# Patient Record
Sex: Female | Born: 1955 | Race: White | Hispanic: No | Marital: Married | Smoking: Former smoker
Health system: Southern US, Community
[De-identification: ages and names within clinical notes are randomized; demographics above are authoritative.]

## PROBLEM LIST (undated history)

## (undated) DIAGNOSIS — K5730 Diverticulosis of large intestine without perforation or abscess without bleeding: Secondary | ICD-10-CM

## (undated) DIAGNOSIS — R6889 Other general symptoms and signs: Secondary | ICD-10-CM

## (undated) DIAGNOSIS — K449 Diaphragmatic hernia without obstruction or gangrene: Secondary | ICD-10-CM

## (undated) DIAGNOSIS — D126 Benign neoplasm of colon, unspecified: Secondary | ICD-10-CM

## (undated) DIAGNOSIS — G479 Sleep disorder, unspecified: Secondary | ICD-10-CM

## (undated) DIAGNOSIS — I8390 Asymptomatic varicose veins of unspecified lower extremity: Secondary | ICD-10-CM

## (undated) DIAGNOSIS — E785 Hyperlipidemia, unspecified: Secondary | ICD-10-CM

## (undated) DIAGNOSIS — K219 Gastro-esophageal reflux disease without esophagitis: Secondary | ICD-10-CM

## (undated) DIAGNOSIS — R202 Paresthesia of skin: Secondary | ICD-10-CM

## (undated) DIAGNOSIS — M8580 Other specified disorders of bone density and structure, unspecified site: Secondary | ICD-10-CM

## (undated) DIAGNOSIS — R209 Unspecified disturbances of skin sensation: Secondary | ICD-10-CM

## (undated) DIAGNOSIS — O1410 Severe pre-eclampsia, unspecified trimester: Secondary | ICD-10-CM

## (undated) DIAGNOSIS — S5290XA Unspecified fracture of unspecified forearm, initial encounter for closed fracture: Secondary | ICD-10-CM

## (undated) DIAGNOSIS — I10 Essential (primary) hypertension: Secondary | ICD-10-CM

## (undated) HISTORY — DX: Gastro-esophageal reflux disease without esophagitis: K21.9

## (undated) HISTORY — DX: Diverticulosis of large intestine without perforation or abscess without bleeding: K57.30

## (undated) HISTORY — DX: Sleep disorder, unspecified: G47.9

## (undated) HISTORY — DX: Diaphragmatic hernia without obstruction or gangrene: K44.9

## (undated) HISTORY — DX: Asymptomatic varicose veins of unspecified lower extremity: I83.90

## (undated) HISTORY — DX: Essential (primary) hypertension: I10

## (undated) HISTORY — DX: Other general symptoms and signs: R68.89

## (undated) HISTORY — DX: Paresthesia of skin: R20.2

## (undated) HISTORY — DX: Unspecified fracture of unspecified forearm, initial encounter for closed fracture: S52.90XA

## (undated) HISTORY — DX: Unspecified disturbances of skin sensation: R20.9

## (undated) HISTORY — DX: Benign neoplasm of colon, unspecified: D12.6

## (undated) HISTORY — DX: Severe pre-eclampsia, unspecified trimester: O14.10

## (undated) HISTORY — DX: Other specified disorders of bone density and structure, unspecified site: M85.80

## (undated) HISTORY — DX: Hyperlipidemia, unspecified: E78.5

---

## 1999-09-29 ENCOUNTER — Other Ambulatory Visit: Admission: RE | Admit: 1999-09-29 | Discharge: 1999-09-29 | Payer: Self-pay | Admitting: *Deleted

## 2002-04-01 ENCOUNTER — Other Ambulatory Visit: Admission: RE | Admit: 2002-04-01 | Discharge: 2002-04-01 | Payer: Self-pay | Admitting: Obstetrics and Gynecology

## 2003-04-09 ENCOUNTER — Other Ambulatory Visit: Admission: RE | Admit: 2003-04-09 | Discharge: 2003-04-09 | Payer: Self-pay | Admitting: Obstetrics and Gynecology

## 2003-06-08 ENCOUNTER — Encounter: Payer: Self-pay | Admitting: Emergency Medicine

## 2003-06-08 ENCOUNTER — Emergency Department (HOSPITAL_COMMUNITY): Admission: EM | Admit: 2003-06-08 | Discharge: 2003-06-08 | Payer: Self-pay | Admitting: Family Medicine

## 2004-02-24 ENCOUNTER — Ambulatory Visit (HOSPITAL_COMMUNITY): Admission: RE | Admit: 2004-02-24 | Discharge: 2004-02-24 | Payer: Self-pay | Admitting: Neurology

## 2004-02-29 ENCOUNTER — Other Ambulatory Visit: Admission: RE | Admit: 2004-02-29 | Discharge: 2004-02-29 | Payer: Self-pay | Admitting: Obstetrics and Gynecology

## 2004-10-27 ENCOUNTER — Ambulatory Visit: Payer: Self-pay | Admitting: Internal Medicine

## 2004-12-07 ENCOUNTER — Ambulatory Visit: Payer: Self-pay | Admitting: Internal Medicine

## 2004-12-14 ENCOUNTER — Ambulatory Visit: Payer: Self-pay | Admitting: Internal Medicine

## 2004-12-26 ENCOUNTER — Ambulatory Visit: Payer: Self-pay | Admitting: Internal Medicine

## 2004-12-27 ENCOUNTER — Ambulatory Visit: Payer: Self-pay | Admitting: Gastroenterology

## 2005-01-04 ENCOUNTER — Ambulatory Visit: Payer: Self-pay | Admitting: Gastroenterology

## 2005-01-13 ENCOUNTER — Ambulatory Visit: Payer: Self-pay | Admitting: Internal Medicine

## 2005-05-12 ENCOUNTER — Ambulatory Visit: Payer: Self-pay | Admitting: Internal Medicine

## 2005-08-16 ENCOUNTER — Ambulatory Visit: Payer: Self-pay | Admitting: Family Medicine

## 2005-10-27 ENCOUNTER — Ambulatory Visit: Payer: Self-pay | Admitting: Internal Medicine

## 2005-11-24 ENCOUNTER — Ambulatory Visit: Payer: Self-pay | Admitting: Internal Medicine

## 2005-12-01 ENCOUNTER — Ambulatory Visit: Payer: Self-pay | Admitting: Internal Medicine

## 2005-12-05 ENCOUNTER — Ambulatory Visit: Payer: Self-pay | Admitting: Internal Medicine

## 2006-08-31 ENCOUNTER — Ambulatory Visit: Payer: Self-pay | Admitting: Internal Medicine

## 2006-08-31 LAB — CONVERTED CEMR LAB
ALT: 15 units/L (ref 0–40)
AST: 19 units/L (ref 0–37)
Albumin: 3.9 g/dL (ref 3.5–5.2)
BUN: 12 mg/dL (ref 6–23)
Basophils Relative: 0.3 % (ref 0.0–1.0)
CO2: 28 meq/L (ref 19–32)
Chloride: 106 meq/L (ref 96–112)
Creatinine, Ser: 0.8 mg/dL (ref 0.4–1.2)
Eosinophil percent: 2 % (ref 0.0–5.0)
GFR calc non Af Amer: 80 mL/min
Glomerular Filtration Rate, Af Am: 97 mL/min/{1.73_m2}
Glucose, Bld: 84 mg/dL (ref 70–99)
Hemoglobin: 14 g/dL (ref 12.0–15.0)
Iron: 63 ug/dL (ref 42–145)
Lymphocytes Relative: 37.1 % (ref 12.0–46.0)
Magnesium: 2.3 mg/dL (ref 1.5–2.5)
Monocytes Relative: 4.9 % (ref 3.0–11.0)
Neutrophils Relative %: 55.7 % (ref 43.0–77.0)
Platelets: 264 10*3/uL (ref 150–400)
Potassium: 3.4 meq/L — ABNORMAL LOW (ref 3.5–5.1)
RBC: 4.83 M/uL (ref 3.87–5.11)
RDW: 12.3 % (ref 11.5–14.6)
Total Bilirubin: 0.5 mg/dL (ref 0.3–1.2)
Transferrin: 220 mg/dL (ref >=212.0)

## 2006-10-22 ENCOUNTER — Ambulatory Visit: Payer: Self-pay | Admitting: Internal Medicine

## 2006-10-22 LAB — CONVERTED CEMR LAB
Magnesium: 2.4 mg/dL (ref 1.5–2.5)
Potassium: 4.4 meq/L (ref 3.5–5.1)

## 2006-11-20 DIAGNOSIS — R202 Paresthesia of skin: Secondary | ICD-10-CM

## 2006-11-20 HISTORY — DX: Paresthesia of skin: R20.2

## 2006-12-21 ENCOUNTER — Ambulatory Visit: Payer: Self-pay | Admitting: Internal Medicine

## 2008-01-31 ENCOUNTER — Encounter: Payer: Self-pay | Admitting: Internal Medicine

## 2008-03-16 ENCOUNTER — Ambulatory Visit: Payer: Self-pay | Admitting: Internal Medicine

## 2008-03-16 DIAGNOSIS — M25569 Pain in unspecified knee: Secondary | ICD-10-CM

## 2008-03-16 DIAGNOSIS — A630 Anogenital (venereal) warts: Secondary | ICD-10-CM

## 2008-03-16 DIAGNOSIS — K449 Diaphragmatic hernia without obstruction or gangrene: Secondary | ICD-10-CM | POA: Insufficient documentation

## 2008-03-16 DIAGNOSIS — K219 Gastro-esophageal reflux disease without esophagitis: Secondary | ICD-10-CM

## 2008-03-16 DIAGNOSIS — M949 Disorder of cartilage, unspecified: Secondary | ICD-10-CM

## 2008-03-16 DIAGNOSIS — M899 Disorder of bone, unspecified: Secondary | ICD-10-CM | POA: Insufficient documentation

## 2008-03-16 DIAGNOSIS — I10 Essential (primary) hypertension: Secondary | ICD-10-CM | POA: Insufficient documentation

## 2008-03-16 HISTORY — DX: Diaphragmatic hernia without obstruction or gangrene: K44.9

## 2008-03-19 ENCOUNTER — Telehealth (INDEPENDENT_AMBULATORY_CARE_PROVIDER_SITE_OTHER): Payer: Self-pay | Admitting: *Deleted

## 2008-03-21 DIAGNOSIS — E785 Hyperlipidemia, unspecified: Secondary | ICD-10-CM

## 2008-03-25 ENCOUNTER — Ambulatory Visit: Payer: Self-pay | Admitting: Internal Medicine

## 2008-03-25 LAB — CONVERTED CEMR LAB
Blood in Urine, dipstick: NEGATIVE
Nitrite: NEGATIVE
Specific Gravity, Urine: 1.02
Urobilinogen, UA: 0.2
pH: 5

## 2008-03-26 LAB — CONVERTED CEMR LAB
ALT: 19 units/L (ref 0–35)
AST: 21 units/L (ref 0–37)
BUN: 12 mg/dL (ref 6–23)
Basophils Absolute: 0 10*3/uL (ref 0.0–0.1)
Bilirubin, Direct: 0.1 mg/dL (ref 0.0–0.3)
Calcium: 9.3 mg/dL (ref 8.4–10.5)
Eosinophils Absolute: 0.2 10*3/uL (ref 0.0–0.7)
Eosinophils Relative: 2.3 % (ref 0.0–5.0)
GFR calc Af Amer: 84 mL/min
HCT: 40.1 % (ref 36.0–46.0)
HDL: 45.4 mg/dL (ref >39.0)
LDL Cholesterol: 118 mg/dL — ABNORMAL HIGH (ref 0–99)
Monocytes Absolute: 0.3 10*3/uL (ref 0.1–1.0)
Neutro Abs: 4 10*3/uL (ref 1.4–7.7)
Platelets: 239 10*3/uL (ref 150–400)
TSH: 0.95 microintl units/mL (ref 0.35–5.50)
Total Protein: 7 g/dL (ref 6.0–8.3)
Triglycerides: 180 mg/dL — ABNORMAL HIGH (ref 0–149)
VLDL: 36 mg/dL (ref 0–40)

## 2008-03-30 ENCOUNTER — Telehealth: Payer: Self-pay | Admitting: Internal Medicine

## 2008-03-31 ENCOUNTER — Telehealth (INDEPENDENT_AMBULATORY_CARE_PROVIDER_SITE_OTHER): Payer: Self-pay | Admitting: *Deleted

## 2008-04-03 ENCOUNTER — Ambulatory Visit: Payer: Self-pay | Admitting: Internal Medicine

## 2008-04-03 DIAGNOSIS — M810 Age-related osteoporosis without current pathological fracture: Secondary | ICD-10-CM | POA: Insufficient documentation

## 2008-04-03 DIAGNOSIS — M8580 Other specified disorders of bone density and structure, unspecified site: Secondary | ICD-10-CM

## 2008-04-03 DIAGNOSIS — D485 Neoplasm of uncertain behavior of skin: Secondary | ICD-10-CM

## 2008-04-03 DIAGNOSIS — I8390 Asymptomatic varicose veins of unspecified lower extremity: Secondary | ICD-10-CM

## 2008-04-03 HISTORY — DX: Other specified disorders of bone density and structure, unspecified site: M85.80

## 2008-04-03 HISTORY — DX: Asymptomatic varicose veins of unspecified lower extremity: I83.90

## 2008-05-11 ENCOUNTER — Encounter: Payer: Self-pay | Admitting: Internal Medicine

## 2008-05-12 ENCOUNTER — Encounter: Admission: RE | Admit: 2008-05-12 | Discharge: 2008-05-12 | Payer: Self-pay | Admitting: Internal Medicine

## 2008-05-12 ENCOUNTER — Encounter: Payer: Self-pay | Admitting: Internal Medicine

## 2008-05-29 ENCOUNTER — Telehealth: Payer: Self-pay | Admitting: *Deleted

## 2008-06-08 ENCOUNTER — Telehealth: Payer: Self-pay | Admitting: Internal Medicine

## 2008-06-09 ENCOUNTER — Encounter: Payer: Self-pay | Admitting: Internal Medicine

## 2008-06-09 ENCOUNTER — Ambulatory Visit: Payer: Self-pay | Admitting: Internal Medicine

## 2008-08-21 ENCOUNTER — Telehealth: Payer: Self-pay | Admitting: *Deleted

## 2008-08-27 ENCOUNTER — Telehealth: Payer: Self-pay | Admitting: Internal Medicine

## 2008-09-01 ENCOUNTER — Ambulatory Visit: Payer: Self-pay | Admitting: Family Medicine

## 2008-09-09 ENCOUNTER — Ambulatory Visit: Payer: Self-pay | Admitting: Internal Medicine

## 2008-09-23 ENCOUNTER — Telehealth: Payer: Self-pay | Admitting: Internal Medicine

## 2008-10-02 ENCOUNTER — Telehealth: Payer: Self-pay | Admitting: *Deleted

## 2008-10-02 ENCOUNTER — Telehealth: Payer: Self-pay | Admitting: Internal Medicine

## 2008-12-18 ENCOUNTER — Ambulatory Visit: Payer: Self-pay | Admitting: Internal Medicine

## 2008-12-18 ENCOUNTER — Encounter: Admission: RE | Admit: 2008-12-18 | Discharge: 2008-12-18 | Payer: Self-pay | Admitting: Internal Medicine

## 2008-12-18 DIAGNOSIS — R1032 Left lower quadrant pain: Secondary | ICD-10-CM | POA: Insufficient documentation

## 2008-12-18 LAB — CONVERTED CEMR LAB
ALT: 19 units/L (ref 0–35)
AST: 18 units/L (ref 0–37)
Basophils Absolute: 0.1 10*3/uL (ref 0.0–0.1)
Bilirubin Urine: NEGATIVE
Bilirubin, Direct: 0.1 mg/dL (ref 0.0–0.3)
Blood in Urine, dipstick: NEGATIVE
Glucose, Urine, Semiquant: NEGATIVE
HCT: 37.3 % (ref 36.0–46.0)
Hemoglobin: 13 g/dL (ref 12.0–15.0)
Ketones, urine, test strip: NEGATIVE
MCHC: 34.8 g/dL (ref 30.0–36.0)
MCV: 85.9 fL (ref 78.0–100.0)
Protein, U semiquant: NEGATIVE
RBC: 4.34 M/uL (ref 3.87–5.11)
Total Bilirubin: 0.6 mg/dL (ref 0.3–1.2)
WBC Urine, dipstick: NEGATIVE
WBC: 11.7 10*3/uL — ABNORMAL HIGH (ref 4.5–10.5)
pH: 5.5

## 2009-01-01 DIAGNOSIS — O1410 Severe pre-eclampsia, unspecified trimester: Secondary | ICD-10-CM | POA: Insufficient documentation

## 2009-01-01 DIAGNOSIS — K5730 Diverticulosis of large intestine without perforation or abscess without bleeding: Secondary | ICD-10-CM | POA: Insufficient documentation

## 2009-01-01 DIAGNOSIS — D126 Benign neoplasm of colon, unspecified: Secondary | ICD-10-CM

## 2009-01-01 HISTORY — DX: Severe pre-eclampsia, unspecified trimester: O14.10

## 2009-01-01 HISTORY — DX: Benign neoplasm of colon, unspecified: D12.6

## 2009-01-01 HISTORY — DX: Diverticulosis of large intestine without perforation or abscess without bleeding: K57.30

## 2009-01-06 ENCOUNTER — Ambulatory Visit: Payer: Self-pay | Admitting: Gastroenterology

## 2009-02-09 ENCOUNTER — Telehealth: Payer: Self-pay | Admitting: Internal Medicine

## 2009-03-20 LAB — CONVERTED CEMR LAB: Pap Smear: NORMAL

## 2009-05-21 ENCOUNTER — Ambulatory Visit: Payer: Self-pay | Admitting: Internal Medicine

## 2009-05-21 LAB — CONVERTED CEMR LAB
AST: 20 units/L (ref 0–37)
Basophils Relative: 0.6 % (ref 0.0–3.0)
Bilirubin Urine: NEGATIVE
Bilirubin, Direct: 0 mg/dL (ref 0.0–0.3)
Blood in Urine, dipstick: NEGATIVE
CO2: 32 meq/L (ref 19–32)
Chloride: 108 meq/L (ref 96–112)
Eosinophils Relative: 4.2 % (ref 0.0–5.0)
Glucose, Bld: 89 mg/dL (ref 70–99)
Glucose, Urine, Semiquant: NEGATIVE
HCT: 37.8 % (ref 36.0–46.0)
LDL Cholesterol: 78 mg/dL (ref 0–99)
Lymphocytes Relative: 39.2 % (ref 12.0–46.0)
MCV: 86.5 fL (ref 78.0–100.0)
Monocytes Absolute: 0.4 10*3/uL (ref 0.1–1.0)
Neutro Abs: 3.9 10*3/uL (ref 1.4–7.7)
Neutrophils Relative %: 50.9 % (ref 43.0–77.0)
Potassium: 3.5 meq/L (ref 3.5–5.1)
RBC: 4.37 M/uL (ref 3.87–5.11)
Specific Gravity, Urine: >=1.03
TSH: 0.9 microintl units/mL (ref 0.35–5.50)
Total Bilirubin: 0.6 mg/dL (ref 0.3–1.2)
Total CHOL/HDL Ratio: 4
Urobilinogen, UA: 1
VLDL: 29.6 mg/dL (ref 0.0–40.0)
WBC: 7.5 10*3/uL (ref 4.5–10.5)

## 2009-06-02 ENCOUNTER — Telehealth: Payer: Self-pay | Admitting: Internal Medicine

## 2009-06-07 HISTORY — PX: OTHER SURGICAL HISTORY: SHX169

## 2009-06-09 ENCOUNTER — Ambulatory Visit: Payer: Self-pay | Admitting: Internal Medicine

## 2009-06-09 DIAGNOSIS — R05 Cough: Secondary | ICD-10-CM

## 2009-06-09 DIAGNOSIS — S5290XA Unspecified fracture of unspecified forearm, initial encounter for closed fracture: Secondary | ICD-10-CM | POA: Insufficient documentation

## 2009-06-09 HISTORY — DX: Unspecified fracture of unspecified forearm, initial encounter for closed fracture: S52.90XA

## 2009-06-17 ENCOUNTER — Ambulatory Visit: Payer: Self-pay | Admitting: Internal Medicine

## 2009-07-23 ENCOUNTER — Encounter: Admission: RE | Admit: 2009-07-23 | Discharge: 2009-07-23 | Payer: Self-pay | Admitting: Orthopedic Surgery

## 2009-08-24 ENCOUNTER — Ambulatory Visit: Payer: Self-pay | Admitting: Internal Medicine

## 2009-08-24 DIAGNOSIS — R6889 Other general symptoms and signs: Secondary | ICD-10-CM

## 2009-08-24 DIAGNOSIS — G479 Sleep disorder, unspecified: Secondary | ICD-10-CM | POA: Insufficient documentation

## 2009-08-24 HISTORY — DX: Sleep disorder, unspecified: G47.9

## 2009-08-24 HISTORY — DX: Other general symptoms and signs: R68.89

## 2009-09-22 ENCOUNTER — Encounter (INDEPENDENT_AMBULATORY_CARE_PROVIDER_SITE_OTHER): Payer: Self-pay | Admitting: *Deleted

## 2009-10-20 ENCOUNTER — Ambulatory Visit: Payer: Self-pay | Admitting: Internal Medicine

## 2009-10-28 ENCOUNTER — Ambulatory Visit (HOSPITAL_COMMUNITY): Admission: RE | Admit: 2009-10-28 | Discharge: 2009-10-28 | Payer: Self-pay | Admitting: Internal Medicine

## 2009-11-29 ENCOUNTER — Encounter (INDEPENDENT_AMBULATORY_CARE_PROVIDER_SITE_OTHER): Payer: Self-pay | Admitting: *Deleted

## 2010-02-23 ENCOUNTER — Telehealth: Payer: Self-pay | Admitting: *Deleted

## 2010-05-24 ENCOUNTER — Telehealth: Payer: Self-pay | Admitting: Internal Medicine

## 2010-05-26 ENCOUNTER — Ambulatory Visit: Payer: Self-pay | Admitting: Internal Medicine

## 2010-05-26 LAB — CONVERTED CEMR LAB
ALT: 13 units/L (ref 0–35)
AST: 17 units/L (ref 0–37)
Albumin: 4 g/dL (ref 3.5–5.2)
Basophils Relative: 0.6 % (ref 0.0–3.0)
Bilirubin Urine: NEGATIVE
CO2: 26 meq/L (ref 19–32)
Chloride: 106 meq/L (ref 96–112)
Cholesterol: 218 mg/dL — ABNORMAL HIGH (ref 0–200)
Direct LDL: 122 mg/dL
Eosinophils Absolute: 0.2 10*3/uL (ref 0.0–0.7)
Eosinophils Relative: 3.1 % (ref 0.0–5.0)
Glucose, Bld: 74 mg/dL (ref 70–99)
HCT: 39 % (ref 36.0–46.0)
HDL: 50.9 mg/dL (ref >39.00)
Lymphocytes Relative: 38.8 % (ref 12.0–46.0)
Lymphs Abs: 2.9 10*3/uL (ref 0.7–4.0)
MCHC: 33.8 g/dL (ref 30.0–36.0)
Monocytes Relative: 5.9 % (ref 3.0–12.0)
Neutrophils Relative %: 51.6 % (ref 43.0–77.0)
Nitrite: NEGATIVE
Platelets: 224 10*3/uL (ref 150.0–400.0)
RBC: 4.46 M/uL (ref 3.87–5.11)
Total CHOL/HDL Ratio: 4
Total Protein: 6.6 g/dL (ref 6.0–8.3)
Triglycerides: 226 mg/dL — ABNORMAL HIGH (ref 0.0–149.0)
Urobilinogen, UA: 0.2
pH: 5

## 2010-06-13 ENCOUNTER — Ambulatory Visit: Payer: Self-pay | Admitting: Internal Medicine

## 2010-06-13 DIAGNOSIS — M25519 Pain in unspecified shoulder: Secondary | ICD-10-CM | POA: Insufficient documentation

## 2010-06-13 DIAGNOSIS — R209 Unspecified disturbances of skin sensation: Secondary | ICD-10-CM | POA: Insufficient documentation

## 2010-06-13 HISTORY — DX: Unspecified disturbances of skin sensation: R20.9

## 2010-06-28 ENCOUNTER — Encounter: Payer: Self-pay | Admitting: Internal Medicine

## 2010-07-11 ENCOUNTER — Encounter: Payer: Self-pay | Admitting: Gastroenterology

## 2010-07-21 IMAGING — US US TRANSVAGINAL NON-OB
1 series · 14 of 25 positions shown · non-contrast
Comparison: None.

CLINICAL DATA: Left lower quadrant pain.  Question ovarian
pathology.

TRANSABDOMINAL AND TRANSVAGINAL ULTRASOUND OF PELVIS
TECHNIQUE: Both transabdominal and transvaginal ultrasound
examinations of the pelvis were performed including evaluation of
the uterus, ovaries, adnexal regions, and pelvic cul-de-sac.

[Series 1: us transvaginal non-ob · 0.32mm/px · 14 of 72 slices shown]
[im 1/72]
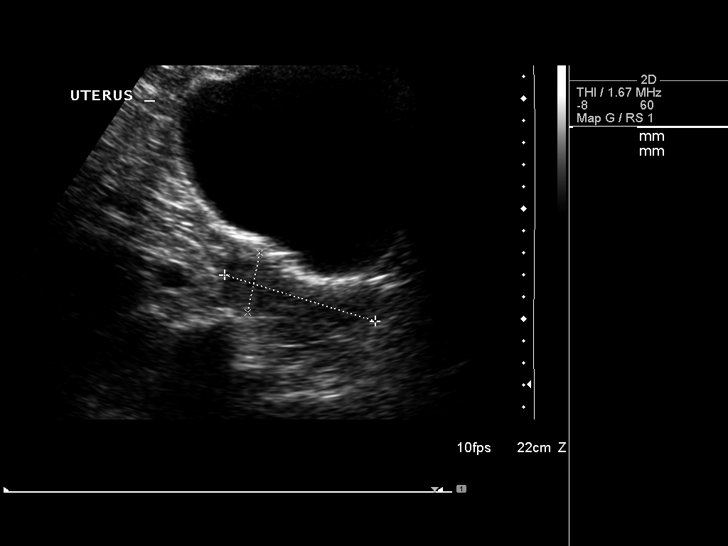
[im 6/72]
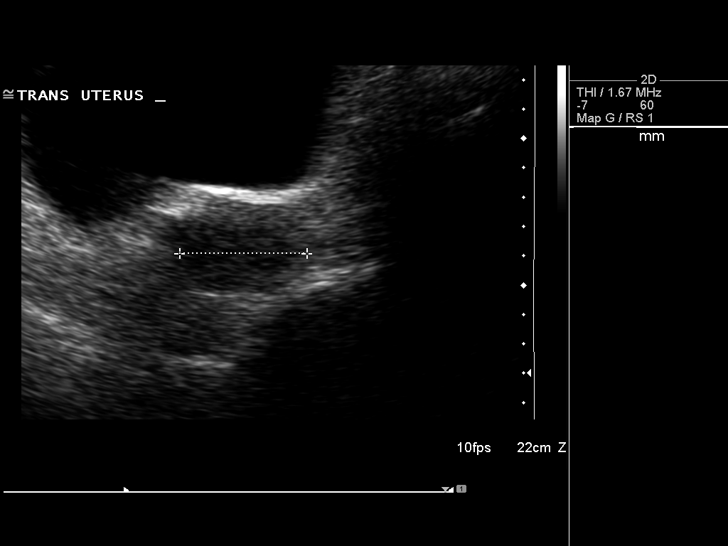
[im 12/72]
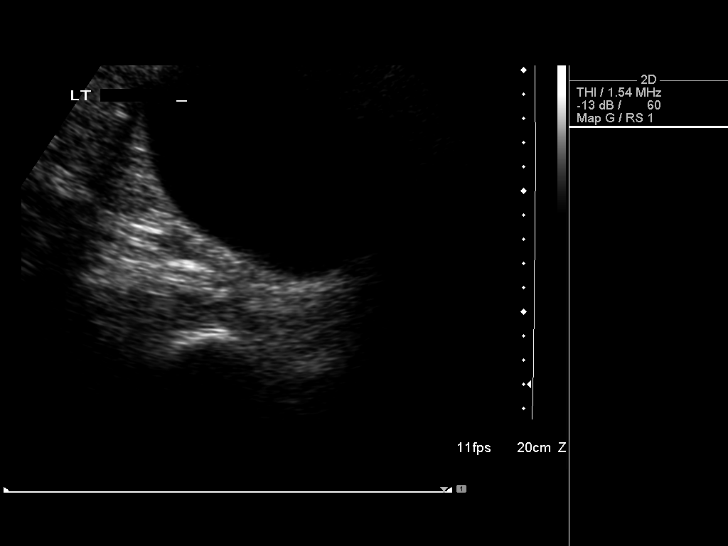
[im 18/72]
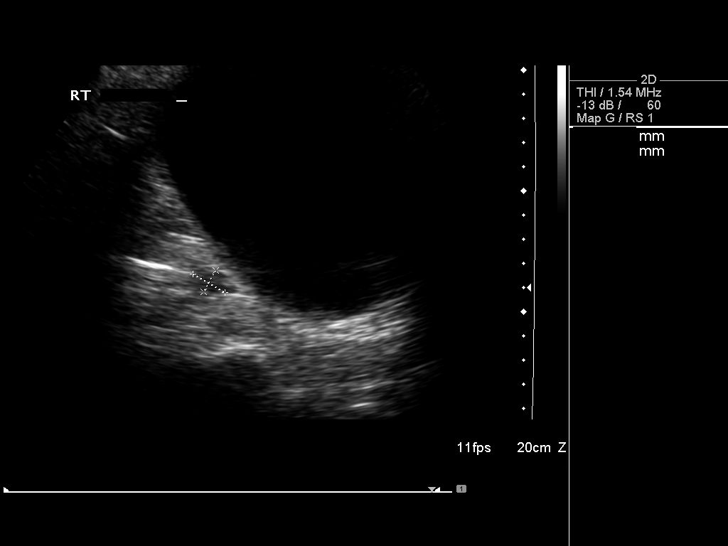
[im 24/72]
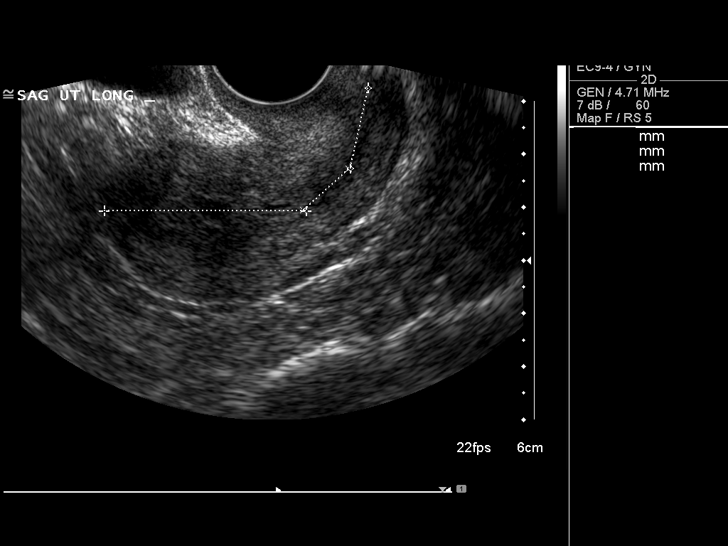
[im 27/72]
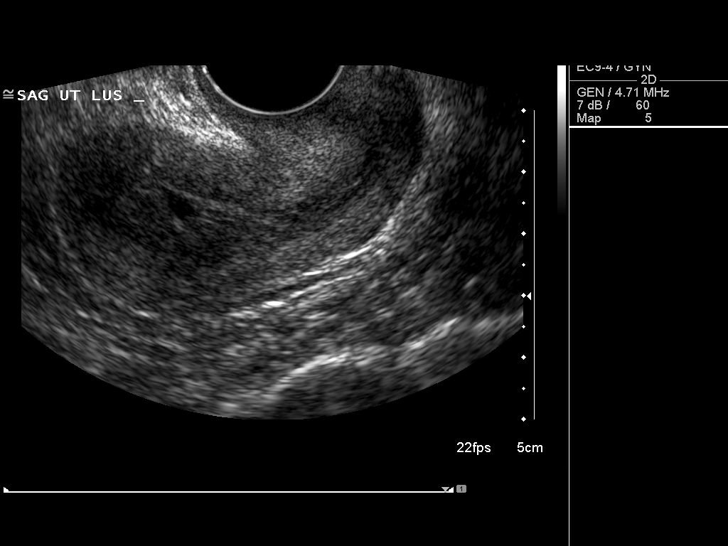
[im 33/72]
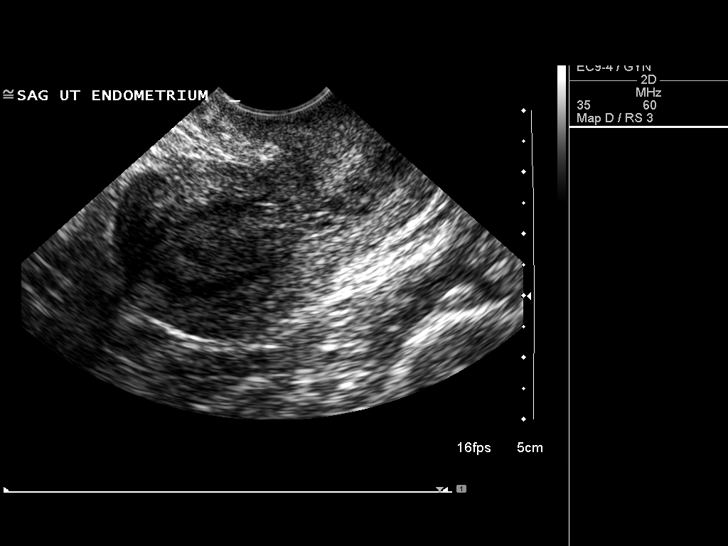
[im 39/72]
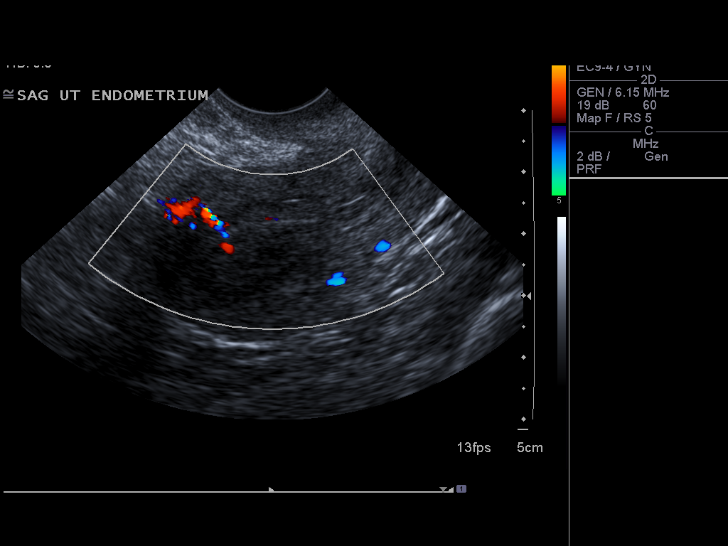
[im 45/72]
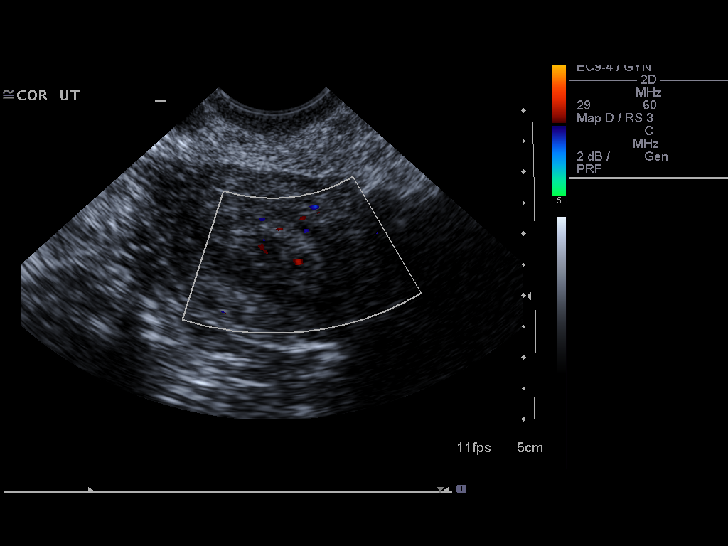
[im 48/72]
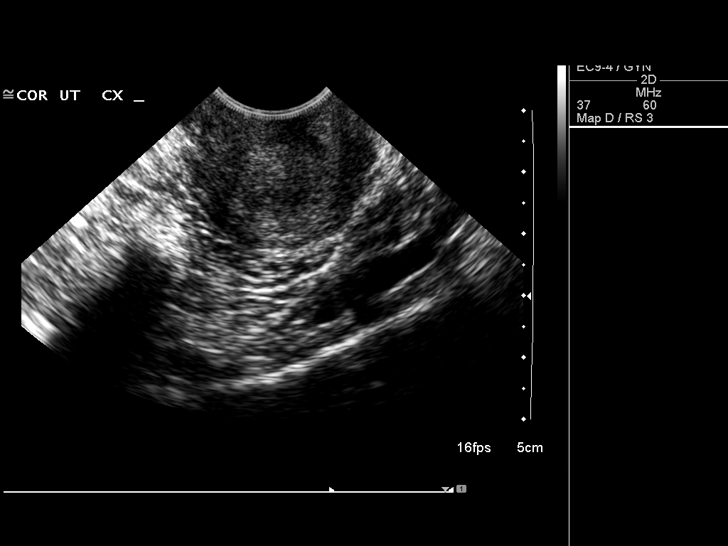
[im 54/72]
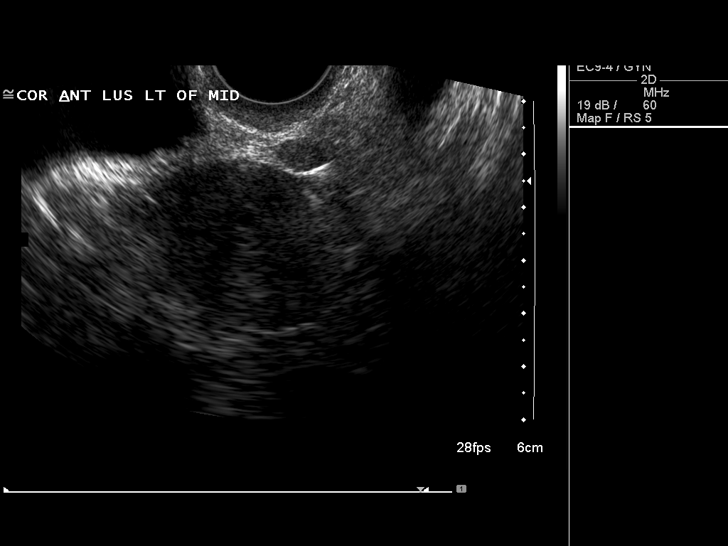
[im 60/72]
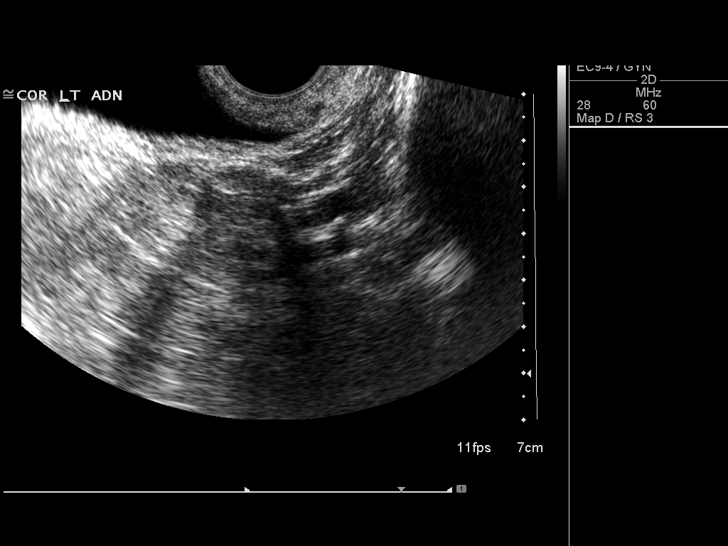
[im 66/72]
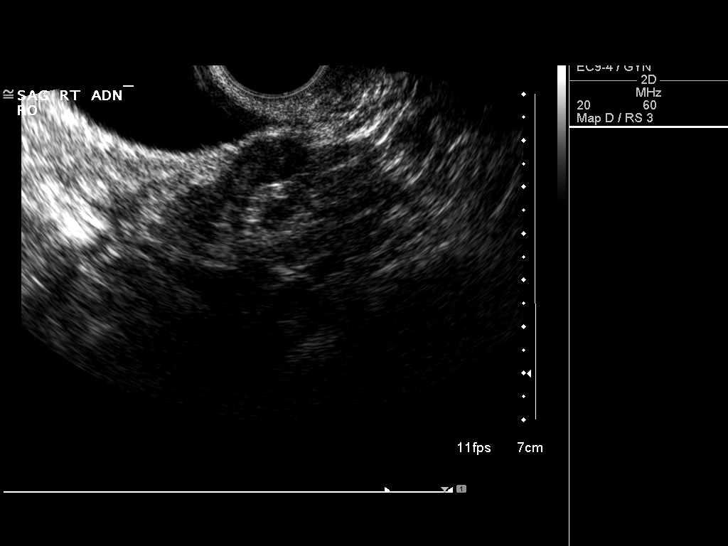
[im 72/72]
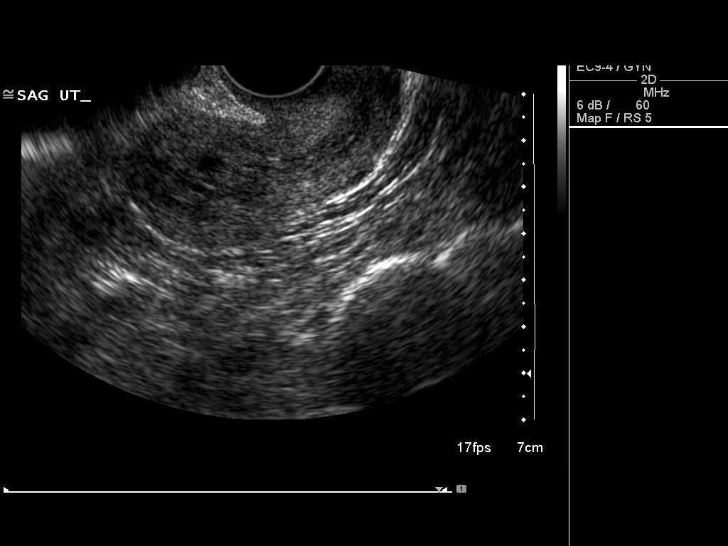

[14 of 25 positions shown; findings below may reference images not displayed]

FINDINGS: The uterus measures 6.5 x 3.0 x 3.5 cm.  Myometrial echotexture is
heterogeneous.  .  Endometrial stripe thickness is endometrial
stripe is thick and irregular, containing areas of cystic change.
Stripe thickness measures 11-12 mm. mm.

The right ovary measures 2.7 x 1.1 x 2.9 cm. The right ovary is
sonographically normal.  The left ovary could not be visualized on
transabdominal or endovaginal scanning.  No evidence for
intraperitoneal free fluid.
IMPRESSION: 12 mm heterogeneous endometrial stripe is abnormal in a post
      menopausal female.  Correlation for vaginal bleeding is
      recommended.  Tissue sampling may be indicated to exclude
      neoplasm.

Nonvisualization of the left ovary.

## 2010-08-15 ENCOUNTER — Encounter (INDEPENDENT_AMBULATORY_CARE_PROVIDER_SITE_OTHER): Payer: Self-pay | Admitting: *Deleted

## 2010-08-17 ENCOUNTER — Ambulatory Visit: Payer: Self-pay | Admitting: Gastroenterology

## 2010-08-24 ENCOUNTER — Telehealth: Payer: Self-pay | Admitting: *Deleted

## 2010-08-30 ENCOUNTER — Telehealth: Payer: Self-pay | Admitting: *Deleted

## 2010-08-31 ENCOUNTER — Ambulatory Visit: Payer: Self-pay | Admitting: Gastroenterology

## 2010-09-01 ENCOUNTER — Encounter: Payer: Self-pay | Admitting: Gastroenterology

## 2010-09-19 ENCOUNTER — Ambulatory Visit: Payer: Self-pay | Admitting: Internal Medicine

## 2010-09-29 ENCOUNTER — Ambulatory Visit: Payer: Self-pay | Admitting: Internal Medicine

## 2010-09-29 ENCOUNTER — Encounter: Payer: Self-pay | Admitting: Internal Medicine

## 2010-10-11 ENCOUNTER — Telehealth: Payer: Self-pay | Admitting: *Deleted

## 2010-10-18 ENCOUNTER — Telehealth: Payer: Self-pay | Admitting: Internal Medicine

## 2010-11-09 ENCOUNTER — Ambulatory Visit: Payer: Self-pay | Admitting: Internal Medicine

## 2010-12-06 ENCOUNTER — Telehealth: Payer: Self-pay | Admitting: *Deleted

## 2010-12-18 LAB — CONVERTED CEMR LAB
CRP, High Sensitivity: 4 (ref 0.00–5.00)
Vit D, 1,25-Dihydroxy: 29 — ABNORMAL LOW (ref 30–89)

## 2010-12-22 NOTE — Letter (Signed)
Summary: Patient Notice- Polyp Results  [facility]  [address] [place], [place] [address]   Phone: [phone]  Fax: [phone]        September 01, 2010 MRN: [id number]    Jessica Green [address] [place], [place]  [address]    Dear Ms. Feagans,  I am pleased to inform you that the colon polyp(s) removed during your recent colonoscopy was (were) found to be benign (no cancer detected) upon pathologic examination.  I recommend you have a repeat colonoscopy examination in 5_ years to look for recurrent polyps, as having colon polyps increases your risk for having recurrent polyps or even colon cancer in the future.  Should you develop new or worsening symptoms of abdominal pain, bowel habit changes or bleeding from the rectum or bowels, please schedule an evaluation with either your primary care physician or with me.  Additional information/recommendations:  __ No further action with gastroenterology is needed at this time. Please      follow-up with your primary care physician for your other healthcare      needs.  __ Please call [phone] to schedule a return visit to review your      situation.  __ Please keep your follow-up visit as already scheduled.  __x Continue treatment plan as outlined the day of your exam.  Please call us if you are having persistent problems or have questions about your condition that have not been fully answered at this time.  Sincerely,  Louis Meckel MD  This letter has been electronically signed by your physician.  Appended Document: Patient Notice- Polyp Results letter mailed

## 2010-12-22 NOTE — Progress Notes (Signed)
Summary: refill on zolpidem  Phone Note From Pharmacy   Caller: [facility] Reason for Call: Needs renewal Details for Reason: zolpidem 12.5mg  Summary of Call: #90 Initial call taken by: Romualdo Bolk, CMA (AAMA),  August 24, 2010 10:23 AM  Follow-up for Phone Call        ok x 2  Follow-up by: Madelin Headings MD,  August 24, 2010 5:31 PM  Additional Follow-up for Phone Call Additional follow up Details #1::        Rx faxed to pharmacy. Additional Follow-up by: Romualdo Bolk, CMA (AAMA),  August 24, 2010 5:36 PM    Prescriptions: AMBIEN CR 12.5 MG CR-TABS (ZOLPIDEM TARTRATE) 1 by mouth once hs  #90 x 1   Entered by:   Romualdo Bolk, CMA (AAMA)   Authorized by:   Madelin Headings MD   Signed by:   Romualdo Bolk, CMA (AAMA) on 08/24/2010   Method used:   Handwritten   RxID:   [id number]

## 2010-12-22 NOTE — Progress Notes (Signed)
Summary: med refill  Phone Note Refill Request Message from:  Patient  Refills Requested: Medication #1:  BENICAR HCT 20-12.5 MG TABS 1 by mouth once daily pt has 2 pills left [facility]  Initial call taken by: Heron Sabins,  October 11, 2010 4:26 PM  Follow-up for Phone Call        Pt should still have another refill left.  Pt state that [facility] has the rx and will not transfer to [facility]. [facility] is saying that we need to call them to get a prior auth. But they won't call us about this. [facility] Pt aware. Follow-up by: Romualdo Bolk, CMA Duncan Dull),  October 11, 2010 5:05 PM    Prescriptions: BENICAR HCT 20-12.5 MG TABS (OLMESARTAN MEDOXOMIL-HCTZ) 1 by mouth once daily  #90 Tablet x 2   Entered by:   Romualdo Bolk, CMA (AAMA)   Authorized by:   Madelin Headings MD   Signed by:   Romualdo Bolk, CMA (AAMA) on 10/11/2010   Method used:   [facility]  [address] [phone][facility]       [address]       [place], [place]  [address]       Ph: [phone] or [phone]       Fax: [phone]   RxID:   [id number]

## 2010-12-22 NOTE — Progress Notes (Signed)
Summary: refill zolpidem  Phone Note Refill Request Message from:  Fax from Pharmacy on May 24, 2010 12:13 PM  Refills Requested: Medication #1:  AMBIEN CR 12.5 MG CR-TABS 1 by mouth once hs   Dosage confirmed as above?Dosage Confirmed   Supply Requested: 3 months   Last Refilled: 04/27/2010  Method Requested: Fax to Local Pharmacy Initial call taken by: Raechel Ache, RN,  May 24, 2010 12:13 PM [facility]er[facility]  Follow-up for Phone Call        ok  to dip 3 months woth with 1 refill  she has appt scheduled in July  Follow-up by: Madelin Headings MD,  May 24, 2010 1:24 PM    Prescriptions: AMBIEN CR 12.5 MG CR-TABS (ZOLPIDEM TARTRATE) 1 by mouth once hs  #90 x 1   Entered by:   Raechel Ache, RN   Authorized by:   Madelin Headings MD   Signed by:   Raechel Ache, RN on 05/24/2010   Method used:   Historical   RxID:   [id number]

## 2010-12-22 NOTE — Progress Notes (Signed)
Summary: Pt called re: bp med. Pt out of samples. Should pt get script?  Phone Note Call from Patient Call back at [facility] Phone [phone]   Caller: Patient Summary of Call: Pt called and is needing to find out what she needs to do re: blood pressure med. Pt has been given samples and is now out. Pt doesnt know if she needs script or not. Environmental consultant.    Initial call taken by: Lucy Antigua,  February 23, 2010 10:56 AM  Follow-up for Phone Call        if her  signs  are better  can rx for 6 months until her OV    if not on her formulary please find out what is  on her fomulary with arb s Follow-up by: Madelin Headings MD,  February 23, 2010 2:02 PM  Additional Follow-up for Phone Call Additional follow up Details #1::        Pt is doing okay on the benicar but she needs a 90 days sent to [facility] and 30 day (one time only) sent to [facility] so she will have some until her 90 days gets here. Rx's sent to the pharmacy. Additional Follow-up by: Romualdo Bolk, CMA (AAMA),  February 23, 2010 2:06 PM    Prescriptions: BENICAR HCT 20-12.5 MG TABS (OLMESARTAN MEDOXOMIL-HCTZ) 1 by mouth once daily  #30 x 0   Entered by:   Romualdo Bolk, CMA (AAMA)   Authorized by:   Madelin Headings MD   Signed by:   Romualdo Bolk, CMA (AAMA) on 02/23/2010   Method used:   [facility]        [facility]  [address] [phone][facility] ([facility]ail[facility]       [address] [place], [place]  [address]       Ph: [phone] or [phone]       Fax: [phone]   RxID:   [id number] BENICAR HCT 20-12.5 MG TABS (OLMESARTAN MEDOXOMIL-HCTZ) 1 by mouth once daily  #90 x 1   Entered by:   Romualdo Bolk, CMA (AAMA)   Authorized by:   Madelin Headings MD   Signed by:   Romualdo Bolk, CMA (AAMA) on 02/23/2010   Method used:   Electronically to        [facility]* (mail-order)             ,          Ph: [phone]       Fax: [phone]   RxID:   [id number]

## 2010-12-22 NOTE — Assessment & Plan Note (Signed)
Summary: CPX/CJR   Vital Signs:  Patient profile:   55 year old female Menstrual status:  postmenopausal Height:      65 inches Weight:      173 pounds Pulse rate:   72 / minute BP sitting:   110 / 60  (left arm) Cuff size:   regular  Vitals Entered By: Romualdo Bolk, CMA (AAMA) (June 13, 2010 9:15 AM) CC: CPX no pap- Pt has a gyn   History of Present Illness: Jessica Green comes in today   for  preventive visit  .  Since last visit  here  there have been no major changes in health status    Shoulder  issues   ? impingement  and  getting   worse    both shoulder  right more than left  tried ibuprofen  some gi effects and then aleve.    burning and pain.   GERD: controlled  HT controlled. Left leg knee down where gets numbness and discomfort at the end of the day.  Sleep: on ambien,   Preventive Care Screening  Prior Values:    Pap Smear:  normal (03/20/2009)    Mammogram:  normal (06/20/2008)    Colonoscopy:  Results: Diverticulosis.        (01/04/2005)    Last Tetanus Booster:  Td (10/10/2002)   Preventive Screening-Counseling & Management  Alcohol-Tobacco     Alcohol drinks/day: <1     Alcohol type: wine     Smoking Status: quit     Year Quit: age 65  Caffeine-Diet-Exercise     Caffeine use/day: 2     Does Patient Exercise: no  Hep-HIV-STD-Contraception     Dental Visit-last 6 months yes     Sun Exposure-Excessive: no  Safety-Violence-Falls     Seat Belt Use: yes     Firearms in the Home: no firearms in the home     Smoke Detectors: yes  Current Medications (verified): 1)  Nexium 40 Mg Cpdr (Esomeprazole Magnesium) .Marland Kitchen.. 1 By Mouth Once Daily 2)  Ambien Cr 12.5 Mg Cr-Tabs (Zolpidem Tartrate) .Marland Kitchen.. 1 By Mouth Once Hs 3)  Benicar Hct 20-12.5 Mg Tabs (Olmesartan Medoxomil-Hctz) .Marland Kitchen.. 1 By Mouth Once Daily  Allergies (verified): No Known Drug Allergies  Past History:  Past medical, surgical, family and social histories (including risk factors)  reviewed, and no changes noted (except as noted below).  Past Medical History: Reviewed history from 06/09/2009 and no changes required. GERD HH Hypertension Osteopenia Dexa  -2.2 spine 2003  -1.9 2009  LS  2008 evaluation for parethesia  neg MRI and lp  (no ms) Hyperlipidemia premature childbirth Broke Rt radius and ulna chipped 2010 Remote hx of Phen phen for 3 month  echo normal 1996 Consults [facility]  Past Surgical History: Reviewed history from 06/09/2009 and no changes required. Childbirth x 1  g3 p1  Caesarean section Rt arm has a steel plate 2/95/62  Past History:  Care Management: Orthopedics: Orlan Leavens Gynecology: Berneice Gandy   ENT: [facility] Laws Gastroenterology: [facility]  Family History: Reviewed history from 12/18/2008 and no changes required. Father hemachromatosis, Alzheimers  now deceased Family History Hypertension Father CVA MOM Cervical cancer  Family History High cholesterol Sister Mesothelioma , colon cancer  and recurrent. terminal  Family History of Colon CA 1st degree relative <60 Nephew with lymphoma  Social History: Reviewed history from 06/09/2009 and no changes required. Married Former Smoker Alcohol Use - no Occupation: Metallurgist Drug Use - no HH  of 3   2 kitens   Review of Systems  The patient denies anorexia, fever, weight loss, weight gain, vision loss, decreased hearing, hoarseness, chest pain, syncope, dyspnea on exertion, peripheral edema, prolonged cough, headaches, hemoptysis, abdominal pain, melena, hematochezia, severe indigestion/heartburn, hematuria, incontinence, genital sores, muscle weakness, suspicious skin lesions, transient blindness, difficulty walking, depression, unusual weight change, abnormal bleeding, enlarged lymph nodes, angioedema, and breast masses.         continued pain and numbness lateral knee down  and worse at end of day.    Physical Exam  General:  Well-developed,well-nourished,in no  acute distress; alert,appropriate and cooperative throughout examination Head:  normocephalic and atraumatic.   Eyes:  PERRL, EOMs full, conjunctiva clear  Ears:  R ear normal, L ear normal, and no external deformities.   Nose:  no external deformity, no external erythema, and no nasal discharge.   Mouth:  good dentition and pharynx pink and moist.   Neck:  No deformities, masses, or tenderness noted. Breasts:  No mass, nodules, thickening, tenderness, bulging, retraction, inflamation, nipple discharge or skin changes noted.   Lungs:  Normal respiratory effort, chest expands symmetrically. Lungs are clear to auscultation, no crackles or wheezes.no dullness.   Heart:  Normal rate and regular rhythm. S1 and S2 normal without gallop, murmur, click, rub or other extra sounds.no lifts.   Abdomen:  Bowel sounds positive,abdomen soft and non-tender without masses, organomegaly or   noted. Genitalia:  per gyne Msk:  no joint swelling and no joint warmth.   decreased  rom shoulder bilaterally right more than left  internal rotaion   no knee effusion and no foot drop or atrophy  Pulses:  pulses intact without delay   nl cap refill  Extremities:  no clubbing cyanosis or edema  no atrophy  Neurologic:  alert & oriented X3, strength normal in all extremities, and gait normal.   sense seems ok in feet   Skin:  turgor normal, color normal, no suspicious lesions, no petechiae, and no purpura.  sk under bra line  Cervical Nodes:  No lymphadenopathy noted Axillary Nodes:  No palpable lymphadenopathy Inguinal Nodes:  No significant adenopathy Psych:  Normal eye contact, appropriate affect. Cognition appears normal.  labs  reviewed   Impression & Recommendations:  Problem # 1:  PREVENTIVE HEALTH CARE (ICD-V70.0) Discussed nutrition,exercise,diet,healthy weight, vitamin D and calcium.   Problem # 2:  HYPERTENSION (ICD-401.9) Assessment: Unchanged  Her updated medication list for this problem includes:     Benicar Hct 20-12.5 Mg Tabs (Olmesartan medoxomil-hctz) .Marland Kitchen... 1 by mouth once daily  BP today: 110/60 Prior BP: 100/60 (10/20/2009)  Labs Reviewed: K+: 4.2 (05/26/2010) Creat: : 0.9 (05/26/2010)   Chol: 218 (05/26/2010)   HDL: 50.90 (05/26/2010)   LDL: 78 (05/21/2009)   TG: 226.0 (05/26/2010)  Problem # 3:  HYPERLIPIDEMIA (ICD-272.4) counseled  intensify lifestyle intervention lose weight Labs Reviewed: SGOT: 17 (05/26/2010)   SGPT: 13 (05/26/2010)   HDL:50.90 (05/26/2010), 42.50 (05/21/2009)  LDL:78 (05/21/2009), 118 (16/08/9603)  Chol:218 (05/26/2010), 150 (05/21/2009)  Trig:226.0 (05/26/2010), 148.0 (05/21/2009)  Problem # 4:  SHOULDER PAIN, BILATERAL (ICD-719.41) ? RC  impingement decrease rom on right      progressive ...    refer for opinion    options discussed  Her updated medication list for this problem includes:    Meloxicam 7.5 Mg Tabs (Meloxicam) .Marland Kitchen... 1 by mouth two times a day  for shoulder pain  Orders: Orthopedic Referral (Ortho)  Problem # 5:  NUMBNESS (  ICD-782.0)  left lateral leg near ficular attachement and radiating for a whie   had neuro see her in the rmote past and no cns diagnosis .   perhaps this is a mechanical and localphenom    will get ortho to check this   Orders: Orthopedic Referral (Ortho)  Problem # 6:  FAMILY HISTORY OF COLON CA 1ST DEGREE RELATIVE <60 (ICD-V16.0) sis ter recurrent   age 52     last colon 2006  have her call  Dr Arlyce Dice office about advisability of next colonoscopy.   Problem # 7:  SLEEP DISORDER/DISTURBANCE (ICD-780.50) no change   Complete Medication List: 1)  Nexium 40 Mg Cpdr (Esomeprazole magnesium) .Marland Kitchen.. 1 by mouth once daily 2)  Ambien Cr 12.5 Mg Cr-tabs (Zolpidem tartrate) .Marland Kitchen.. 1 by mouth once hs 3)  Benicar Hct 20-12.5 Mg Tabs (Olmesartan medoxomil-hctz) .Marland Kitchen.. 1 by mouth once daily 4)  Meloxicam 7.5 Mg Tabs (Meloxicam) .Marland Kitchen.. 1 by mouth two times a day  for shoulder pain  Patient Instructions: 1)  exercising like  walking 20-30 minutes    5-7 days per week can  help your triglycerides and   prevent  vascular dementia.  2)  take  antiinflammatory  and will do referral in the meantime for your shoulders and left leg numbness. 3)  No change in meds . 4)  Your last colon was with Dr Arlyce Dice  in 2006   .  5)  will do ortho referral 6)  check up in a year but can call for return office visit about lipid recheck after lifestyle intervention  Prescriptions: MELOXICAM 7.5 MG TABS (MELOXICAM) 1 by mouth two times a day  for shoulder pain  #60 x 1   Entered and Authorized by:   Madelin Headings MD   Signed by:   Madelin Headings MD on 06/13/2010   Method used:   [facility]        [facility]  [address] [id number]       [address]       [place], [place]  [address]       Ph: [phone] or [phone]       Fax: [phone]   RxID:   [id number]

## 2010-12-22 NOTE — Assessment & Plan Note (Signed)
Summary: FLU SHOT/CB   Nurse Visit   Allergies: No Known Drug Allergies  Orders Added: 1)  Admin 1st Vaccine [90471] 2)  Flu Vaccine 32yrs + [[address]]    Flu Vaccine Consent Questions     Do you have a history of severe allergic reactions to this vaccine? no    Any prior history of allergic reactions to egg and/or gelatin? no    Do you have a sensitivity to the preservative Thimersol? no    Do you have a past history of Guillan-Barre Syndrome? no    Do you currently have an acute febrile illness? no    Have you ever had a severe reaction to latex? no    Vaccine information given and explained to patient? yes    Are you currently pregnant? no    Lot Number:[id number]   Exp Date:05/20/2011   Site Given  Left Deltoid IM Romualdo Bolk, CMA (AAMA)  September 19, 2010 9:23 AM

## 2010-12-22 NOTE — Letter (Signed)
Summary: [facility]  [facility]   Imported By: Maryln Gottron 07/07/2010 13:16:10  _____________________________________________________________________  External Attachment:    Type:   Image     Comment:   External Document

## 2010-12-22 NOTE — Letter (Signed)
Summary: Previsit letter  [facility]  [address] [place], [place] [address]   Phone: [phone]  Fax: [phone]       07/11/2010 MRN: [id number]  Jessica Green [address] [place], [place]  [address]  Dear Ms. Capelle,  Welcome to the Gastroenterology Division at [facility].    You are scheduled to see a nurse for your pre-procedure visit on 08-11-10  at 10am on the 3rd floor at [facility], [address]  We ask that you try to arrive at our office 15 minutes prior to your appointment time to allow for check-in.  Your nurse visit will consist of discussing your medical and surgical history, your immediate family medical history, and your medications.    Please bring a complete list of all your medications or, if you prefer, bring the medication bottles and we will list them.  We will need to be aware of both prescribed and over the counter drugs.  We will need to know exact dosage information as well.  If you are on blood thinners (Coumadin, Plavix, Aggrenox, Ticlid, etc.) please call our office today/prior to your appointment, as we need to consult with your physician about holding your medication.   Please be prepared to read and sign documents such as consent forms, a financial agreement, and acknowledgement forms.  If necessary, and with your consent, a friend or relative is welcome to sit-in on the nurse visit with you.  Please bring your insurance card so that we may make a copy of it.  If your insurance requires a referral to see a specialist, please bring your referral form from your primary care physician.  No co-pay is required for this nurse visit.     If you cannot keep your appointment, please call [phone] to cancel or reschedule prior to your appointment date.  This allows [place] the opportunity to schedule an appointment for another patient in need of care.    Thank you for choosing [facility] for your medical needs.   We appreciate the opportunity to care for you.  Please visit [place] at our website  to learn more about our practice.                     Sincerely.                                                                                                                   The Gastroenterology Division

## 2010-12-22 NOTE — Procedures (Signed)
Summary: Colonoscopy  Patient: Jessica Green Note: All result statuses are Final unless otherwise noted.  Tests: (1) Colonoscopy (COL)   COL Colonoscopy           DONE     [facility]     [address]     [place], [place]  [address]           COLONOSCOPY PROCEDURE REPORT           PATIENT:  Jessica, Green  MR#:  [id number]     BIRTHDATE:  October 25, 1956, 54 yrs. old  GENDER:  female           ENDOSCOPIST:  Barbette Hair. Arlyce Dice, MD     Referred by:           PROCEDURE DATE:  08/31/2010     PROCEDURE:  Colonoscopy with snare polypectomy     ASA CLASS:  Class II     INDICATIONS:  1) screening  2) family history of colon cancer     Sister           MEDICATIONS:   Fentanyl 100 mcg IV, Versed 10 mg IV, Benadryl 12.5     mg IV           DESCRIPTION OF PROCEDURE:   After the risks benefits and     alternatives of the procedure were thoroughly explained, informed     consent was obtained.  Digital rectal exam was performed and     revealed no abnormalities.   The LB 180AL [id number] endoscope was     introduced through the anus and advanced to the cecum, which was     identified by both the appendix and ileocecal valve, without     limitations.  The quality of the prep was good, using MiraLax.     The instrument was then slowly withdrawn as the colon was fully     examined.     <<PROCEDUREIMAGES>>           FINDINGS:  A sessile polyp was found in the proximal transverse     colon. It was 3 mm in size. Polyp was snared without cautery.     Retrieval was successful (see image5). snare polyp  Moderate     diverticulosis was found in the sigmoid colon (see image12).  This     was otherwise a normal examination of the colon (see image2,     image3, image6, image7, image9, image10, image13, and image15).     Retroflexed views in the rectum revealed no abnormalities.    The     time to cecum =  12.0  minutes. The scope was then withdrawn (time     =  8.5  min) from the patient and the  procedure completed.           COMPLICATIONS:  None           ENDOSCOPIC IMPRESSION:     1) 3 mm sessile polyp in the proximal transverse colon     2) Moderate diverticulosis in the sigmoid colon     3) Otherwise normal examination     RECOMMENDATIONS:     1) Given your significant family history of colon cancer, you     should have a repeat colonoscopy in 5 years     2) MAC sedation for procedures           REPEAT EXAM:  In 5 year(s) for Colonoscopy.  ______________________________     Barbette Hair Arlyce Dice, MD           CC: Madelin Headings, MD           n.     Rosalie DoctorBarbette Hair. Kaplan at 08/31/2010 10:28 AM           Page 2 of 3   [place], Crance Gordon, [phone]  Note: An exclamation mark (!) indicates a result that was not dispersed into the flowsheet. Document Creation Date: 08/31/2010 10:28 AM _______________________________________________________________________  (1) Order result status: Final Collection or observation date-time: 08/31/2010 10:20 Requested date-time:  Receipt date-time:  Reported date-time:  Referring Physician:   Ordering Physician: Melvia Heaps [phone]) Specimen Source:  Source: Launa Grill Order Number: [id number] Lab site:   Appended Document: Colonoscopy     Procedures Next Due Date:    Colonoscopy: 08/2015

## 2010-12-22 NOTE — Letter (Signed)
Summary: [facility] Referral (unable to schedule) Notification  [facility]  [address] [place], [place] [address]   Phone: [phone]  Fax: [phone]      November 29, 2009 Jessica Green 01-Jan-1956 MRN: [id number]   Jessica Green [address] [place], [place]  [address]   Dear Dr. Fabian Sharp:   Thank you for your kind referral of the above patient. We have attempted to schedule the recommended Consult appoinment but have been unable to schedule because:  __ The patient was not available by phone and/or has not returned our calls.  _x_ The patient declined to schedule at this time.  We appreciate the referral and hope that we will have the opportunity to treat this patient in the future.    Sincerely,   [facility]  Vania Rea. Jarold Motto M.D. Hedwig Morton. Juanda Chance M.D. Venita Lick. Russella Dar M.D. Wilhemina Bonito. Marina Goodell M.D. Barbette Hair. Arlyce Dice M.D. Iva Boop M.D. Cheron Every.D.

## 2010-12-22 NOTE — Miscellaneous (Signed)
Summary: BONE DENSITY  Clinical Lists Changes  Orders: Added new Test order of T-Bone Densitometry (77080) - Signed Added new Test order of T-Lumbar Vertebral Assessment (77082) - Signed 

## 2010-12-22 NOTE — Progress Notes (Signed)
Summary: Needs alternative to benicar hct  Phone Note Other Incoming   Caller: Prior Auth Summary of Call: Pt needs a prior auth for benicar hct. Her ins wouldn't cover it unless she has failed a ARB or ARB comb. The other laternatives are losartan, losartan/hctz, diovan, diovan hct, micardis and micardis hct. Initial call taken by: Romualdo Bolk, CMA Duncan Dull),  October 18, 2010 5:08 PM  Follow-up for Phone Call        can try micardis  hctz   40/12.5  disp 30  refill x 6 months . call  or ov in 1-2  month  after changing about how her bp readings are doing . OV if elevated . Follow-up by: Madelin Headings MD,  October 18, 2010 6:57 PM  Additional Follow-up for Phone Call Additional follow up Details #1::        Phone call completed, Patient called Additional Follow-up by: Alfred Levins, CMA,  October 19, 2010 2:24 PM    New/Updated Medications: MICARDIS HCT 40-12.5 MG TABS (TELMISARTAN-HCTZ) 1 by mouth once daily Prescriptions: MICARDIS HCT 40-12.5 MG TABS (TELMISARTAN-HCTZ) 1 by mouth once daily  #30 x 6   Entered by:   Alfred Levins, CMA   Authorized by:   Madelin Headings MD   Signed by:   Alfred Levins, CMA on 10/19/2010   Method used:   [facility]  [address] [address] [phone][facility]retail[facility]       [address]       [place], [place]  [address]       Ph: [phone] or [phone]       Fax: [phone]   RxID:   [id number]

## 2010-12-22 NOTE — Progress Notes (Signed)
Summary: refill on ambien cr  Phone Note From Pharmacy   Caller: [facility] Reason for Call: Needs renewal Details for Reason: ambien cr 12.5mg  Summary of Call: last filled on 01/25/10 #30- Rx was written on 08/24/09 #90 with 1 refill. Can we do 90 tabs at a time with refills? Initial call taken by: Romualdo Bolk, CMA (AAMA),  February 23, 2010 1:07 PM  Follow-up for Phone Call        yes Follow-up by: Madelin Headings MD,  February 23, 2010 1:59 PM  Additional Follow-up for Phone Call Additional follow up Details #1::        Per Dr. Fabian Sharp- 6 months worth. Rx faxed back to the pharmacy.    Prescriptions: AMBIEN CR 12.5 MG CR-TABS (ZOLPIDEM TARTRATE) 1 by mouth once hs  #90 x 1   Entered by:   Romualdo Bolk, CMA (AAMA)   Authorized by:   Madelin Headings MD   Signed by:   Romualdo Bolk, CMA (AAMA) on 02/23/2010   Method used:   Handwritten   RxID:   [id number]

## 2010-12-22 NOTE — Progress Notes (Signed)
Summary: Change back to accuretic  Phone Note Call from Patient Call back at Work Phone [phone]   Caller: Patient Call For: Madelin Headings MD Summary of Call: Is asking to speak to [facility] about a change of lots of meds and only wants to speak to [facility]. Initial call taken by: [facility] CMA AAMA,  December 06, 2010 11:32 AM  Follow-up for Phone Call        Spoke to pt- she was changed to micardis for bp. BP is not staying down like it should be. Pt wants to go back Accuretic. The lowest it has been 130/80 highest 150/84. Nexium hasn't made a difference either. She wants to go back on generic Pepcid 40mg  as well.  Follow-up by: Romualdo Bolk, CMA Duncan Dull),  December 06, 2010 12:10 PM  Additional Follow-up for Phone Call Additional follow up Details #1::        she may change back to accuretic hctz 20 12.5  disp 30 refill x 3   also pepcid 40  mg ? 1-2 x per day  disp 60 refill x 3  and then return office visit  Additional Follow-up by: Madelin Headings MD,  December 06, 2010 6:23 PM    Additional Follow-up for Phone Call Additional follow up Details #2::    Rx's sent to pharmacy and left message on machine for pt to call back. Follow-up by: Romualdo Bolk, CMA Duncan Dull),  December 07, 2010 11:57 AM  Additional Follow-up for Phone Call Additional follow up Details #3:: Details for Additional Follow-up Action Taken: Pt aware and will call back to schedule a follow up appt. Additional Follow-up by: Romualdo Bolk, CMA (AAMA),  December 07, 2010 12:02 PM  New/Updated Medications: PEPCID 40 MG TABS (FAMOTIDINE) 1-2 by mouth once daily ACCURETIC 20-12.5 MG TABS (QUINAPRIL-HYDROCHLOROTHIAZIDE) 1 by mouth once daily Prescriptions: PEPCID 40 MG TABS (FAMOTIDINE) 1-2 by mouth once daily  #60 x 3   Entered by:   Romualdo Bolk, CMA (AAMA)   Authorized by:   Madelin Headings MD   Signed by:   Romualdo Bolk, CMA (AAMA) on 12/07/2010   Method used:   [facility]      [facility]  [address] [phone][facility]ail[facility]       [address]       [place], [place]  [address]       Ph: [phone] or [phone]       Fax: [phone]   RxID:   [id number] ACCURETIC 20-12.5 MG TABS (QUINAPRIL-HYDROCHLOROTHIAZIDE) 1 by mouth once daily  #30 x 3   Entered by:   Romualdo Bolk, CMA (AAMA)   Authorized by:   Madelin Headings MD   Signed by:   Romualdo Bolk, CMA (AAMA) on 12/07/2010   Method used:   [facility]        [facility]  [address] [phone][facility]ail[facility]       [address]       [place], [place]  [address]       Ph: [phone] or [phone]       Fax: [phone]   RxID:   [id number]

## 2010-12-22 NOTE — Miscellaneous (Signed)
Summary: [facility] Previsit/prep  Clinical Lists Changes  Medications: Added new medication of MOVIPREP 100 GM  SOLR (PEG-KCL-NACL-NASULF-NA ASC-C) As per prep instructions. - Signed Rx of MOVIPREP 100 GM  SOLR (PEG-KCL-NACL-NASULF-NA ASC-C) As per prep instructions.;  #1 x 0;  Signed;  Entered by: Wyona Almas RN;  Authorized by: Louis Meckel MD;  Method used: [phone] [address] [place], [place], [place]  [address], Ph: [phone] or [phone], Fax: [phone] Observations: Added new observation of NKA: T (08/17/2010 14:20)    Prescriptions: MOVIPREP 100 GM  SOLR (PEG-KCL-NACL-NASULF-NA ASC-C) As per prep instructions.  #1 x 0   Entered by:   Wyona Almas RN   Authorized by:   Louis Meckel MD   Signed by:   Wyona Almas RN on 08/17/2010   Method used:   [facility]        [facility]  [address] [phone][facility] (retail)       [address]       [place], [place]  [address]       Ph: [phone] or [phone]       Fax: [phone]   RxID:   [id number]

## 2010-12-22 NOTE — Progress Notes (Signed)
Summary: needs direction  Phone Note From Pharmacy   Caller: [phone] opt [phone] Summary of Call: new rx zolpidem tartate 12.5mg  needs direction to fill med. Initial call taken by: Heron Sabins,  August 30, 2010 10:47 AM  Follow-up for Phone Call        I called pharmacy and gave them the direactions. Follow-up by: Romualdo Bolk, CMA (AAMA),  August 30, 2010 2:11 PM

## 2010-12-22 NOTE — Letter (Signed)
Summary: [facility] Instructions  [facility]  [address] [place], [place] [address]   Phone: [phone]  Fax: [phone]       Jessica Green    1956/02/06    MRN: [id number]        Procedure Day Dorna Bloom: Wednesday 08-31-10     Arrival Time: 8:30 a.m.     Procedure Time: 9:30 a.m.     Location of Procedure:                    _x _  [facility] (4th Floor)  PREPARATION FOR COLONOSCOPY WITH MOVIPREP   Starting 5 days prior to your procedure  08-26-10 do not eat nuts, seeds, popcorn, corn, beans, peas,  salads, or any raw vegetables.  Do not take any fiber supplements (e.g. Metamucil, Citrucel, and Benefiber).  THE DAY BEFORE YOUR PROCEDURE         DATE:  08-30-10  DAY:  Tuesday  1.  Drink clear liquids the entire day-NO SOLID FOOD  2.  Do not drink anything colored red or purple.  Avoid juices with pulp.  No orange juice.  3.  Drink at least 64 oz. (8 glasses) of fluid/clear liquids during the day to prevent dehydration and help the prep work efficiently.  CLEAR LIQUIDS INCLUDE: Water Jello Ice Popsicles Tea (sugar ok, no milk/cream) Powdered fruit flavored drinks Coffee (sugar ok, no milk/cream) Gatorade Juice: apple, white grape, white cranberry  Lemonade Clear bullion, consomm, broth Carbonated beverages (any kind) Strained chicken noodle soup Hard Candy                             4.  In the morning, mix first dose of MoviPrep solution:    Empty 1 Pouch A and 1 Pouch B into the disposable container    Add lukewarm drinking water to the top line of the container. Mix to dissolve    Refrigerate (mixed solution should be used within 24 hrs)  5.  Begin drinking the prep at 5:00 p.m. The MoviPrep container is divided by 4 marks.   Every 15 minutes drink the solution down to the next mark (approximately 8 oz) until the full liter is complete.   6.  Follow completed prep with 16 oz of clear liquid of your choice (Nothing red or  purple).  Continue to drink clear liquids until bedtime.  7.  Before going to bed, mix second dose of MoviPrep solution:    Empty 1 Pouch A and 1 Pouch B into the disposable container    Add lukewarm drinking water to the top line of the container. Mix to dissolve    Refrigerate  THE DAY OF YOUR PROCEDURE      DATE:  08-31-10  DAY:  Wednesday  Beginning at  4:30 a.m. (5 hours before procedure):         1. Every 15 minutes, drink the solution down to the next mark (approx 8 oz) until the full liter is complete.  2. Follow completed prep with 16 oz. of clear liquid of your choice.    3. You may drink clear liquids until  7:30 a.m.  (2 HOURS BEFORE PROCEDURE).   MEDICATION INSTRUCTIONS  Unless otherwise instructed, you should take regular prescription medications with a small sip of water   as early as possible the morning of your procedure.   Additional medication instructions: Hold  Benicar/HCT the morning of  procedure.       OTHER INSTRUCTIONS  You will need a responsible adult at least 57 years of age to accompany you and drive you home.   This person must remain in the waiting room during your procedure.  Wear loose fitting clothing that is easily removed.  Leave jewelry and other valuables at home.  However, you may wish to bring a book to read or  an iPod/MP3 player to listen to music as you wait for your procedure to start.  Remove all body piercing jewelry and leave at home.  Total time from sign-in until discharge is approximately 2-3 hours.  You should go home directly after your procedure and rest.  You can resume normal activities the  day after your procedure.  The day of your procedure you should not:   Drive   Make legal decisions   Operate machinery   Drink alcohol   Return to work  You will receive specific instructions about eating, activities and medications before you leave.    The above instructions have been reviewed and explained  to me by   Wyona Almas RN  August 17, 2010 3:05 PM     I fully understand and can verbalize these instructions _____________________________ Date _________

## 2011-01-13 ENCOUNTER — Encounter: Payer: Self-pay | Admitting: Internal Medicine

## 2011-01-16 ENCOUNTER — Encounter: Payer: Self-pay | Admitting: Internal Medicine

## 2011-01-16 ENCOUNTER — Ambulatory Visit (INDEPENDENT_AMBULATORY_CARE_PROVIDER_SITE_OTHER): Payer: Managed Care, Other (non HMO) | Admitting: Internal Medicine

## 2011-01-16 DIAGNOSIS — R072 Precordial pain: Secondary | ICD-10-CM

## 2011-01-16 DIAGNOSIS — R209 Unspecified disturbances of skin sensation: Secondary | ICD-10-CM

## 2011-01-16 DIAGNOSIS — R0789 Other chest pain: Secondary | ICD-10-CM | POA: Insufficient documentation

## 2011-01-16 DIAGNOSIS — I10 Essential (primary) hypertension: Secondary | ICD-10-CM

## 2011-01-16 DIAGNOSIS — R079 Chest pain, unspecified: Secondary | ICD-10-CM | POA: Insufficient documentation

## 2011-01-16 DIAGNOSIS — G479 Sleep disorder, unspecified: Secondary | ICD-10-CM

## 2011-01-16 DIAGNOSIS — R05 Cough: Secondary | ICD-10-CM

## 2011-01-16 DIAGNOSIS — M949 Disorder of cartilage, unspecified: Secondary | ICD-10-CM

## 2011-01-16 MED ORDER — MELOXICAM 7.5 MG PO TABS: 7.5000 mg | ORAL_TABLET | Freq: Two times a day (BID) | ORAL | Status: DC

## 2011-01-16 NOTE — Assessment & Plan Note (Signed)
On going for mopnths  And worse recently   Surely acts like CC chest wall pain  Parasternal but sometimes has other sx   Although doesn't osund cardiac as  If contant and worse with  Touch,. Still has a throat clearing cough and  Persisted on arb last year although a bit  Worse this month back on an ace .  Unclear association.

## 2011-01-16 NOTE — Assessment & Plan Note (Signed)
On going  Like throat clearing and" drainage in am  "  rx for reflux without much success and also change ace to arb with ?  slight improvement.

## 2011-01-16 NOTE — Patient Instructions (Signed)
Get chest x ray   Take antiinflammatory for at least  2-3 weeks   ( with an acid blocker )  This acts like a chest wall pain costochondritis.   But consider getting further evaluation if persistant.    Monitor your blood pressure     Consider dash diet.  To help.  Can consider change in to Hyzaar  100/12.5  For your Bp   It is a generic but unsure of cost.

## 2011-01-18 ENCOUNTER — Encounter: Payer: Self-pay | Admitting: Internal Medicine

## 2011-01-18 IMAGING — CR DG CHEST 2V
2 series · 2 of 2 positions shown · non-contrast
Comparison: None

CLINICAL DATA: Congestion, cough, former smoker

CHEST - 2 VIEW

[view not recorded (1 of 2)]
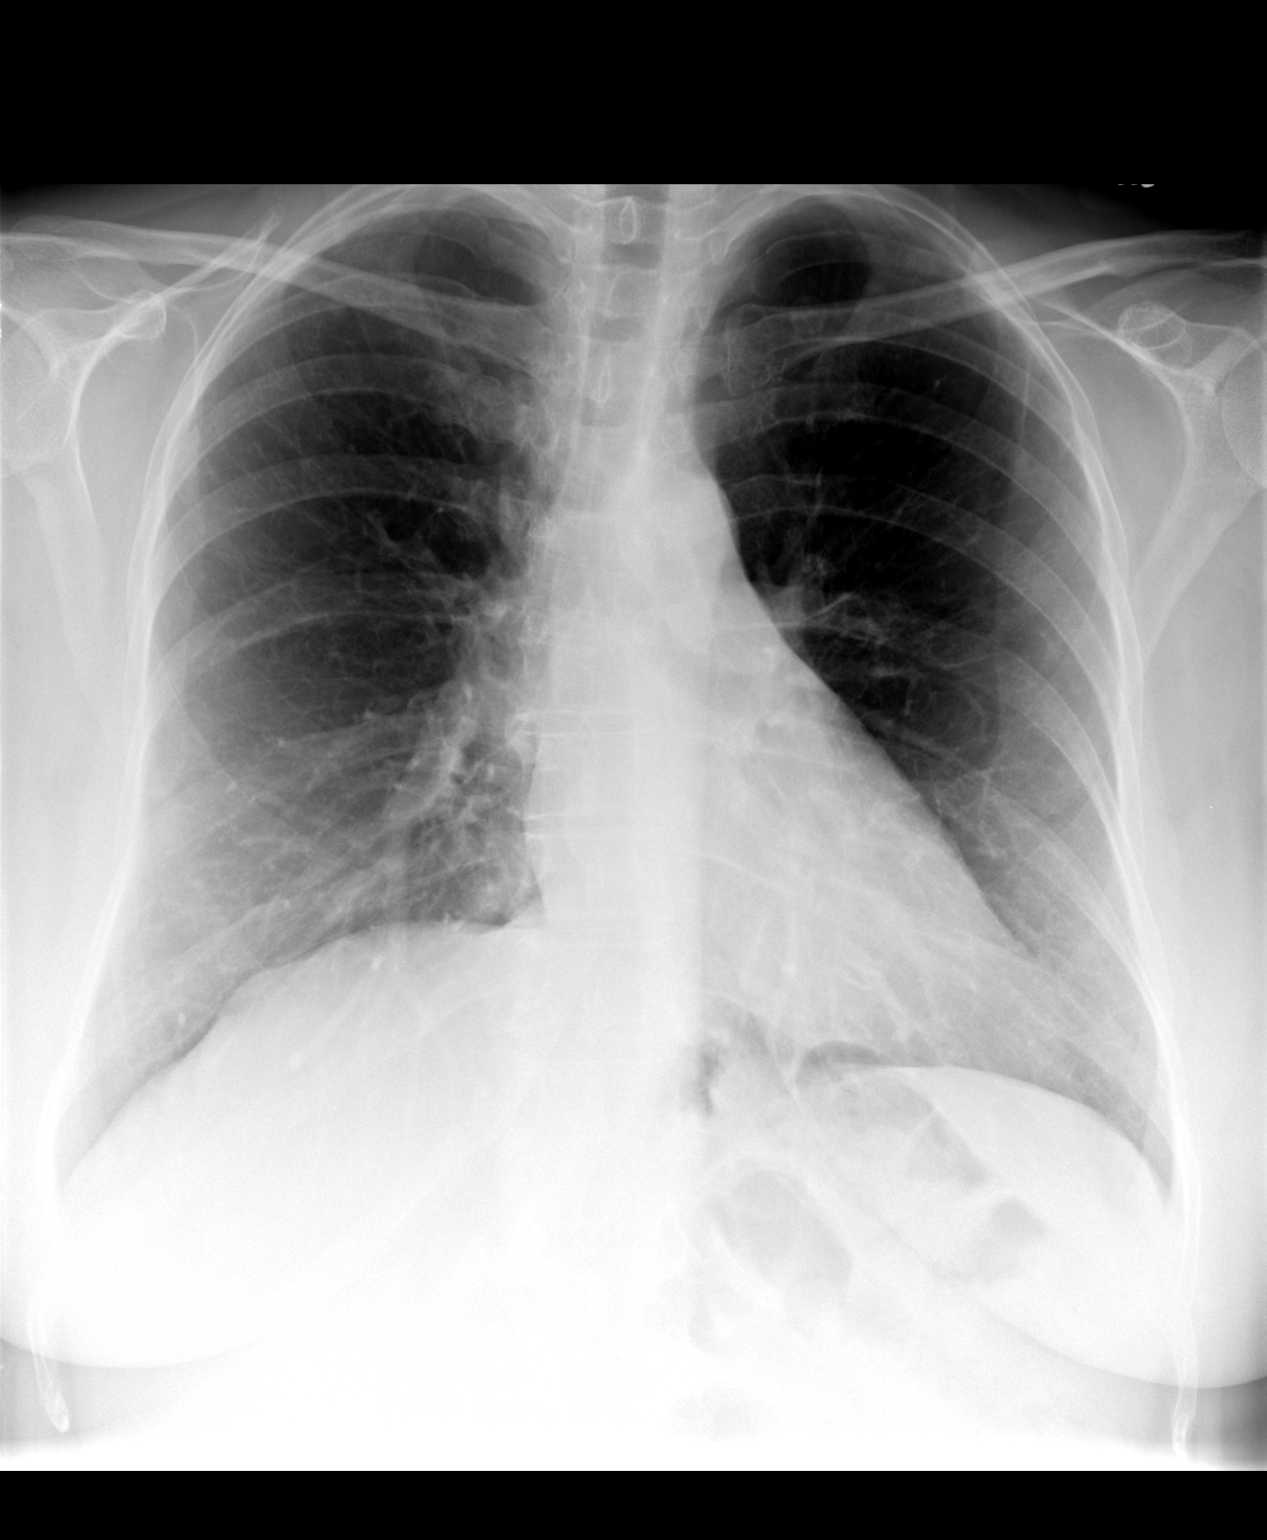

[view not recorded (2 of 2)]
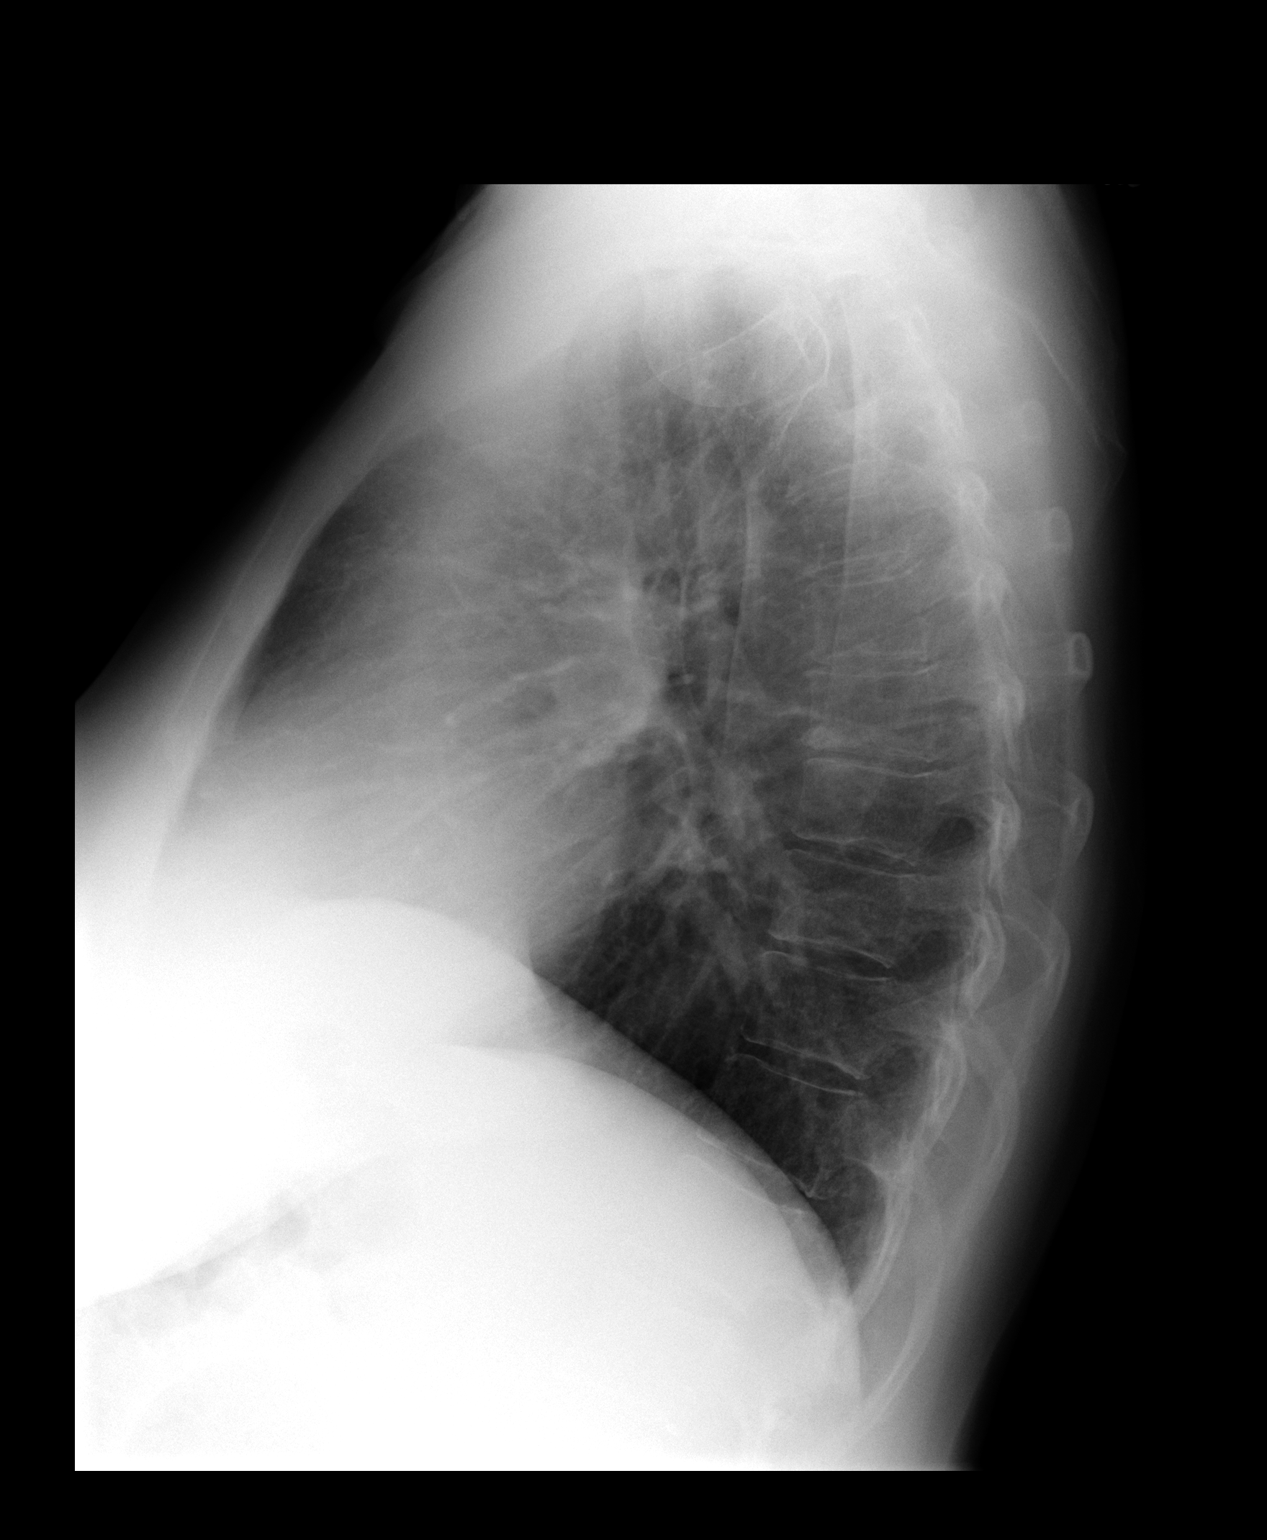

[2 of 2 positions shown; findings below may reference images not displayed]

FINDINGS: No active infiltrate or effusion is seen.  The heart is
within normal limits in size.  No bony abnormality is seen.
IMPRESSION: No active lung disease.

## 2011-01-18 NOTE — Assessment & Plan Note (Addendum)
Adequate control had been better on above Benicar. We'll follow

## 2011-01-18 NOTE — Progress Notes (Signed)
  Subjective:    Patient ID: Jessica Green, female    DOB: 1955-11-25, 55 y.o.   MRN: [id number]  HPI  patient comes in today because of problems with chest pain. She's had this since the last visit but it is now becoming more persistent. She's been treating at with her acid blocker because she has a history of throat clearing but this does not seem to make a difference. She describes it as left parasternal localized pain unclear if related to breathing coughing shortness of breath. No obvious GI symptoms with this. The pain waxes and wanes but is almost always there. It is worse when she lays in a certain  Way and better i another. Blood pressure seems to be controlled although not as well as on the Benicar. She was switched back to Accupril after a year because of cost reasons. She doesn't think that her cough changed much on the Benicar.    Past Medical History  Diagnosis Date  . COLONIC POLYPS, HYPERPLASTIC 01/01/2009  . VARICOSE VEINS, LOWER EXTREMITIES 04/03/2008  . DIVERTICULOSIS, COLON 01/01/2009  . SLEEP DISORDER/DISTURBANCE 08/24/2009  . Other symptoms involving head and neck 08/24/2009  . FRACTURE, RADIUS, RIGHT 06/09/2009  . HIATAL HERNIA WITH REFLUX 03/16/2008  . OSTEOPOROSIS 04/03/2008  . GERD (gastroesophageal reflux disease)   . Hyperlipidemia   . Hypertension   . HELLP SYNDROME 01/01/2009   Past Surgical History  Procedure Date  . Steel plate 02/26/8118    Rt arm   . Cesarean section     reports that she has quit smoking. She does not have any smokeless tobacco history on file. She reports that she uses illicit drugs (Cocaine). She reports that she does not drink alcohol. family history includes Alzheimer's disease in her mother; Cervical cancer in her mother; Colon cancer in her sister; Dementia in her father; Hypertension in her mother; and Other in her father, mother, and sister.      Review of Systems  no fever weight loss sweating has a mucousy cough in the morning.    Doesn't feel bad otherwise.  No nausea vomiting diarrhea. Rest no change   Is not  On meloxicam now.      Objective:   Physical Exam  well-developed well-nourished in no acute distress HEENT grossly normal neck supple without masses or thyromegaly or bruit. Chest:  Clear to A&P without wheezes rales or rhonchi    No deformity or breast masses however she is tender seemingly a long the left parasternal  ,  costochondral junction about T5 but no crepitus. Breast: normal by inspection . No dimpling, discharge, masses, tenderness or discharge .  CV:  S1-S2 no gallops or murmurs peripheral perfusion is normal. Abdomen:  Sof,t normal bowel sounds without hepatosplenomegaly, no guarding rebound or masses no CVA tenderness Neuro: intact non focal   EKG nsr reviewed      Assessment & Plan:

## 2011-01-26 ENCOUNTER — Ambulatory Visit (INDEPENDENT_AMBULATORY_CARE_PROVIDER_SITE_OTHER)
Admission: RE | Admit: 2011-01-26 | Discharge: 2011-01-26 | Disposition: A | Discharge status: Home / Self Care | Payer: Managed Care, Other (non HMO) | Source: Ambulatory Visit | Attending: Internal Medicine | Admitting: Internal Medicine

## 2011-01-26 DIAGNOSIS — R079 Chest pain, unspecified: Secondary | ICD-10-CM

## 2011-01-26 DIAGNOSIS — R05 Cough: Secondary | ICD-10-CM

## 2011-02-15 ENCOUNTER — Ambulatory Visit: Payer: Managed Care, Other (non HMO) | Admitting: Internal Medicine

## 2011-02-20 ENCOUNTER — Encounter: Payer: Self-pay | Admitting: Internal Medicine

## 2011-02-20 ENCOUNTER — Ambulatory Visit (INDEPENDENT_AMBULATORY_CARE_PROVIDER_SITE_OTHER): Payer: Managed Care, Other (non HMO) | Admitting: Internal Medicine

## 2011-02-20 VITALS — BP 120/80 | HR 72 | Ht 65.0 in | Wt 176.0 lb

## 2011-02-20 DIAGNOSIS — I10 Essential (primary) hypertension: Secondary | ICD-10-CM

## 2011-02-20 DIAGNOSIS — R079 Chest pain, unspecified: Secondary | ICD-10-CM

## 2011-02-20 DIAGNOSIS — M8580 Other specified disorders of bone density and structure, unspecified site: Secondary | ICD-10-CM

## 2011-02-20 DIAGNOSIS — K219 Gastro-esophageal reflux disease without esophagitis: Secondary | ICD-10-CM

## 2011-02-20 DIAGNOSIS — R202 Paresthesia of skin: Secondary | ICD-10-CM | POA: Insufficient documentation

## 2011-02-20 DIAGNOSIS — G479 Sleep disorder, unspecified: Secondary | ICD-10-CM

## 2011-02-20 MED ORDER — ZOLPIDEM TARTRATE ER 12.5 MG PO TBCR: 12.5000 mg | EXTENDED_RELEASE_TABLET | Freq: Every evening | ORAL | Status: DC | PRN

## 2011-02-20 NOTE — Patient Instructions (Addendum)
Ask pharmacy benefits  See if they can send you a copy of formulary for blood pressure and  Acid reflux meds Losaartan HCTZ  Med 100/12.5 or 50/12.5 or 100/25  Each day for Blood pressure. Also ask if other  PPI than nexium that would be covered for your relflux    Generic protonix  40 mg per  Day or there.  Would like to change your med and then  Plan follow up after a few months.

## 2011-02-20 NOTE — Progress Notes (Signed)
  Subjective:    Patient ID: Jessica Green, female    DOB: 1956/05/11, 55 y.o.   MRN: [id number]  HPI Patient comes in for a followup as discussed. Multiple medical issues. Bp seems a bit higher    130 /80- on the current regimen and we had to switch back from Benicar because of insurance cost.  Chest pain better still sore at times. No new symptoms. ? If Gi related   But better . Mucous in throat still present no cough.     No allergy sx .  Sleep:  Needs refill of the Ambien CR has been taking this for a while asks about getting off the medication and how that would go. She is always had difficulty with sleep.  Has done a good job with sleep hygiene.  Review of Systems Negative shortness of breath major changes in vision allergy symptoms presenting bleeding.  Past Medical History  Diagnosis Date  . COLONIC POLYPS, HYPERPLASTIC 01/01/2009  . VARICOSE VEINS, LOWER EXTREMITIES 04/03/2008  . DIVERTICULOSIS, COLON 01/01/2009  . SLEEP DISORDER/DISTURBANCE 08/24/2009  . Other symptoms involving head and neck 08/24/2009  . FRACTURE, RADIUS, RIGHT 06/09/2009  . HIATAL HERNIA WITH REFLUX 03/16/2008  . Osteopenia 04/03/2008    -2.2 spine -2.9 2009 ls   . GERD (gastroesophageal reflux disease)     Has HH   . Hyperlipidemia   . Hypertension     remote hx of phen phen echo nl 1996  . HELLP SYNDROME 01/01/2009  . Paresthesia 2008    Neg eval mri LP no ms   Past Surgical History  Procedure Date  . Steel plate 6/96/2952    Rt arm   . Cesarean section     reports that she has quit smoking. She does not have any smokeless tobacco history on file. She reports that she uses illicit drugs (Cocaine). She reports that she does not drink alcohol. family history includes Alzheimer's disease in her mother; Cervical cancer in her mother; Colon cancer in her sister; Dementia in her father; Hypertension in her mother; and Other in her father, mother, and sister. No Known Allergies     Objective:   Physical Exam Well-developed well-nourished in no acute distress normocephalic HEENT no acute findings neck without masses or thyromegaly chest CTA. BS =l cardiac S1-S2 no gallops murmurs nl extr perfusion. Abdomen:  Sof,t normal bowel sounds without hepatosplenomegaly, no guarding rebound or masses no CVA tenderness BP repeat 132/80 right 124/82 left reg cuff sitting  Neuro non focal         Assessment & Plan:    Hypertension running on the higher end of goal with changing back to the current medication. She was better controlled on the Benicar in the past. She will look into formulary choices on her [employer] and we'll choose from them but may go back to an ARB  to see as she does and if it affects her throat clearing.  Chest pain improved still store at times seems to still be chest wall though associated with GI function  GERD heartburn not severe not totally controlled the Nexium caused her lots of money so she is taking Pepcid over-the-counter. We'll try generic protonix if it is in her formulary change medications as above would suggest followup in about 2-3 months. Sleep: counseled about trying to wean off and no meds

## 2011-02-23 ENCOUNTER — Other Ambulatory Visit: Payer: Self-pay | Admitting: Internal Medicine

## 2011-03-08 ENCOUNTER — Telehealth: Payer: Self-pay | Admitting: *Deleted

## 2011-03-08 NOTE — Telephone Encounter (Signed)
The insurance will cover the following for Pepcid: Aciphex and generic, dexilant, pantoprazole, prevacid otc, prevacid, lansoprazole, prilosec otc and rx, omeprazole, protonix and generic, nexium, zantac, zegarid otc and rx. Pt states that nexium didn't work when she tried it in the past.  For Accuretic: They will cover: aliskiren hemifumarate, eprosartan mesylate/hctz, aliskiren hemifumarate/amlodipine besylate, Hyzaar, irbesartan, irbesartan/hctz, losartan, aliskiren hemifumarate.valsartan, amturnide, atacand, losartan/hctz, atacand Hct, avalide, micardis and micardis hct, Avapro, azilsartan medoxomil, azilsartan medoxomil/chlorthalidone, olmesartan medoxomil and olmesartan medoxomil/hctz, benicar and benicar hct, tekamio, tekturna and tekturna hct, candesarta cilexetil and candesartan cilexetil/hctz, telmisartan, cozaar, telmisartan/hctz, Diovan and Diovan Hct, teveten and tevetan hct

## 2011-03-09 NOTE — Telephone Encounter (Signed)
Left message to call back  

## 2011-03-09 NOTE — Telephone Encounter (Signed)
Change to protonix one by mouth daily refill x6 months Her blood pressure changed to losartan HCTZ 100/12.5  take one by mouth daily refill enough for 6 months Her followup appointment  in about 2 months. Monitor her blood pressure in the meantime.

## 2011-03-13 MED ORDER — LOSARTAN POTASSIUM-HCTZ 100-12.5 MG PO TABS: 1.0000 | ORAL_TABLET | Freq: Every day | ORAL | Status: DC

## 2011-03-13 MED ORDER — PANTOPRAZOLE SODIUM 40 MG PO TBEC: 40.0000 mg | DELAYED_RELEASE_TABLET | Freq: Every day | ORAL | Status: DC

## 2011-03-13 NOTE — Telephone Encounter (Signed)
Pt aware and rx sent to pharmacy. She will call back to schedule a follow up appt in 2 months.

## 2011-08-25 ENCOUNTER — Other Ambulatory Visit: Payer: Self-pay | Admitting: *Deleted

## 2011-08-25 NOTE — Telephone Encounter (Signed)
Refill on zolpidem 12.5mg  last filled on 07/29/11 #30

## 2011-08-28 MED ORDER — ZOLPIDEM TARTRATE ER 12.5 MG PO TBCR: 12.5000 mg | EXTENDED_RELEASE_TABLET | Freq: Every evening | ORAL | Status: DC | PRN

## 2011-08-28 NOTE — Telephone Encounter (Signed)
Ok  To refill x 1  

## 2011-08-28 NOTE — Telephone Encounter (Signed)
Rx sent to pharmacy   

## 2011-09-29 ENCOUNTER — Other Ambulatory Visit: Payer: Self-pay | Admitting: *Deleted

## 2011-09-29 NOTE — Telephone Encounter (Signed)
It appears that she was to come in after BP med change  Please call and confirm med list and bp control    Also it looks like her last labs were in July 2011 if this is accurate then she is due for blood monitoring  Of her meds . Plan schedule cpx with labs in the next 2-3 months and  Refill ambien until then.  If this is ok then can refill

## 2011-09-29 NOTE — Telephone Encounter (Signed)
Refill on zolpidem LOV 03/02/11 NOV none

## 2011-10-02 NOTE — Telephone Encounter (Signed)
Pt states that her above medication list is correct and her bp is under control. She will call back to schedule a cpx. rx faxed to pharmacy.

## 2011-10-09 ENCOUNTER — Ambulatory Visit (INDEPENDENT_AMBULATORY_CARE_PROVIDER_SITE_OTHER): Payer: Managed Care, Other (non HMO) | Admitting: Internal Medicine

## 2011-10-09 ENCOUNTER — Encounter: Payer: Self-pay | Admitting: Internal Medicine

## 2011-10-09 VITALS — BP 138/87 | HR 88 | Wt 178.0 lb

## 2011-10-09 DIAGNOSIS — I10 Essential (primary) hypertension: Secondary | ICD-10-CM

## 2011-10-09 DIAGNOSIS — K219 Gastro-esophageal reflux disease without esophagitis: Secondary | ICD-10-CM

## 2011-10-09 DIAGNOSIS — G479 Sleep disorder, unspecified: Secondary | ICD-10-CM

## 2011-10-09 DIAGNOSIS — K449 Diaphragmatic hernia without obstruction or gangrene: Secondary | ICD-10-CM

## 2011-10-09 MED ORDER — LOSARTAN POTASSIUM-HCTZ 100-12.5 MG PO TABS: 1.0000 | ORAL_TABLET | Freq: Every day | ORAL | Status: DC

## 2011-10-09 MED ORDER — ZOLPIDEM TARTRATE ER 12.5 MG PO TBCR: 12.5000 mg | EXTENDED_RELEASE_TABLET | Freq: Every evening | ORAL | Status: DC | PRN

## 2011-10-09 MED ORDER — PANTOPRAZOLE SODIUM 40 MG PO TBEC: 40.0000 mg | DELAYED_RELEASE_TABLET | Freq: Every day | ORAL | Status: DC

## 2011-10-09 NOTE — Progress Notes (Signed)
  Subjective:    Patient ID: Jessica Green, female    DOB: [birth year], 55 y.o.   MRN: [id number]  HPI Patient comes in today for follow up of  multiple medical problems.  More specifically HT:  Had changes med to see if gerd throat sx better and then cost difference . Is now on losaratan hctz and no se noted   Throat clearing about the same. GERD HH: on protonix still  Sleep  Still taking to  Help  Her situation Has her BP machine and readings mostly al in range   Review of Systems No  sob cough   Bleeding vomiting or change in health Past history family history social history reviewed in the electronic medical record.     Objective:   Physical Exam  WDWN in nad Looks well BP  reading reviewed   Has machine  Chest:  Clear to A&P without wheezes rales or rhonchi CV:  S1-S2 no gallops or murmurs peripheral perfusion is normal Record review    Assessment & Plan:  HT ok after change med refill med  Labs at yearly check Throat clearing and HH reflux no change refill med  Suggest not take all the pills in one swallow   Sleep  continuing stable   Refill meds

## 2011-10-09 NOTE — Patient Instructions (Signed)
Continue same  Monitoring.  Try splitting up swallowing the meds at one time. ROV at wellness visit in  [place].

## 2011-10-09 NOTE — Assessment & Plan Note (Signed)
Taking meds on a reg basis

## 2011-11-19 ENCOUNTER — Other Ambulatory Visit: Payer: Self-pay | Admitting: Internal Medicine

## 2011-12-20 ENCOUNTER — Other Ambulatory Visit (INDEPENDENT_AMBULATORY_CARE_PROVIDER_SITE_OTHER): Payer: Managed Care, Other (non HMO)

## 2011-12-20 DIAGNOSIS — Z0000 Encounter for general adult medical examination without abnormal findings: Secondary | ICD-10-CM

## 2011-12-20 LAB — POCT URINALYSIS DIPSTICK
Bilirubin, UA: NEGATIVE
Blood, UA: NEGATIVE
Nitrite, UA: NEGATIVE
pH, UA: 5.5

## 2011-12-20 LAB — CBC WITH DIFFERENTIAL/PLATELET
Eosinophils Absolute: 0.1 10*3/uL (ref 0.0–0.7)
MCHC: 33.9 g/dL (ref 30.0–36.0)
MCV: 87.3 fl (ref 78.0–100.0)
Monocytes Absolute: 0.4 10*3/uL (ref 0.1–1.0)
Neutrophils Relative %: 53.8 % (ref 43.0–77.0)
Platelets: 220 10*3/uL (ref 150.0–400.0)

## 2011-12-20 LAB — BASIC METABOLIC PANEL
BUN: 15 mg/dL (ref 6–23)
Chloride: 107 mEq/L (ref 96–112)
Creatinine, Ser: 1 mg/dL (ref 0.4–1.2)

## 2011-12-20 LAB — HEPATIC FUNCTION PANEL
Bilirubin, Direct: 0 mg/dL (ref 0.0–0.3)
Total Bilirubin: 0.8 mg/dL (ref 0.3–1.2)
Total Protein: 6.9 g/dL (ref 6.0–8.3)

## 2011-12-20 LAB — LIPID PANEL
Cholesterol: 211 mg/dL — ABNORMAL HIGH (ref 0–200)
Total CHOL/HDL Ratio: 4
Triglycerides: 166 mg/dL — ABNORMAL HIGH (ref 0.0–149.0)
VLDL: 33.2 mg/dL (ref 0.0–40.0)

## 2011-12-27 ENCOUNTER — Encounter: Payer: Self-pay | Admitting: Internal Medicine

## 2011-12-27 ENCOUNTER — Ambulatory Visit (INDEPENDENT_AMBULATORY_CARE_PROVIDER_SITE_OTHER): Payer: Managed Care, Other (non HMO) | Admitting: Internal Medicine

## 2011-12-27 VITALS — BP 140/82 | HR 60 | Ht 65.0 in | Wt 177.0 lb

## 2011-12-27 DIAGNOSIS — M949 Disorder of cartilage, unspecified: Secondary | ICD-10-CM

## 2011-12-27 DIAGNOSIS — E538 Deficiency of other specified B group vitamins: Secondary | ICD-10-CM

## 2011-12-27 DIAGNOSIS — G478 Other sleep disorders: Secondary | ICD-10-CM

## 2011-12-27 DIAGNOSIS — K219 Gastro-esophageal reflux disease without esophagitis: Secondary | ICD-10-CM

## 2011-12-27 DIAGNOSIS — G479 Sleep disorder, unspecified: Secondary | ICD-10-CM

## 2011-12-27 DIAGNOSIS — K449 Diaphragmatic hernia without obstruction or gangrene: Secondary | ICD-10-CM

## 2011-12-27 DIAGNOSIS — M899 Disorder of bone, unspecified: Secondary | ICD-10-CM

## 2011-12-27 DIAGNOSIS — E785 Hyperlipidemia, unspecified: Secondary | ICD-10-CM

## 2011-12-27 DIAGNOSIS — Z0000 Encounter for general adult medical examination without abnormal findings: Secondary | ICD-10-CM | POA: Insufficient documentation

## 2011-12-27 MED ORDER — ZOLPIDEM TARTRATE ER 12.5 MG PO TBCR: 12.5000 mg | EXTENDED_RELEASE_TABLET | Freq: Every evening | ORAL | Status: DC | PRN

## 2011-12-27 NOTE — Progress Notes (Signed)
Subjective:    Patient ID: Jessica Green, female    DOB: 07-27-1956, 56 y.o.   MRN: [id number]  HPI Patient comes in today for Preventive Health Care visit  And medical management  HT:  Doing ok and controlled   GERD :stable  takin reg  protonix unsure if ever had a EGD no dysphagia on med for cough also after seeing ent a while back  Sleep:  Taking ambien on reg basis cant sleep without it. No se next day per report.     Bilateral shoulder issues and frozen  Shoulder and therapy.   Right  Better.   . Left getting stiff    Getting b12 shots at office closer to hers every month  ?PA vs low levels related to her ppi no records for this  Review of Systems ROS:  GEN/ HEENT: No fever, significant weight changes sweats headaches vision problems hearing changes, CV/ PULM; No chest pain shortness of breath cough, syncope,edema  change in exercise tolerance. GI /GU: No adominal pain, vomiting, change in bowel habits. No blood in the stool. No significant GU symptoms. SKIN/HEME: ,no acute skin rashes suspicious lesions or bleeding. No lymphadenopathy, nodules, masses.  NEURO/ PSYCH:  No newneurologic signs such . No depression anxiety. IMM/ Allergy: No unusual infections.  Allergy .   REST of 12 system review negative except as per HPI     Objective:   Physical Exam Physical Exam: Vital signs reviewed WUJ:WJXB is a well-developed well-nourished alert cooperative  white female who appears her stated age in no acute distress.  HEENT: normocephalic atraumatic , Eyes: PERRL EOM's full, conjunctiva clear, Nares: paten,t no deformity discharge or tenderness., Ears: no deformity EAC's clear TMs with normal landmarks. Mouth: clear OP, no lesions, edema.  Moist mucous membranes. Dentition in adequate repair. NECK: supple without masses, thyromegaly or bruits. CHEST/PULM:  Clear to auscultation and percussion breath sounds equal no wheeze , rales or rhonchi. No chest wall deformities or  tenderness. CV: PMI is nondisplaced, S1 S2 no gallops, murmurs, rubs. Peripheral pulses are full without delay.No JVD .  Breast: normal by inspection . No dimpling, discharge, masses, tenderness or discharge .  ABDOMEN: Bowel sounds normal nontender  No guard or rebound, no hepato splenomegal no CVA tenderness.  No hernia. Extremtities:  No clubbing cyanosis or edema, no acute joint swelling or redness no focal atrophy NEURO:  Oriented x3, cranial nerves 3-12 appear to be intact, no obvious focal weakness,gait within normal limits no abnormal reflexes or asymmetrical SKIN: No acute rashes normal turgor, color, no bruising or petechiae. PSYCH: Oriented, good eye contact, no obvious depression anxiety, cognition and judgment appear normal. LN: no cervical axillary inguinal adenopathy Has a gyne   Lab Results  Component Value Date   WBC 7.4 12/20/2011   HGB 13.5 12/20/2011   HCT 39.8 12/20/2011   PLT 220.0 12/20/2011   GLUCOSE 85 12/20/2011   CHOL 211* 12/20/2011   TRIG 166.0* 12/20/2011   HDL 56.10 12/20/2011   LDLDIRECT 119.3 12/20/2011   LDLCALC 78 05/21/2009   ALT 15 12/20/2011   AST 15 12/20/2011   NA 143 12/20/2011   K 3.6 12/20/2011   CL 107 12/20/2011   CREATININE 1.0 12/20/2011   BUN 15 12/20/2011   CO2 27 12/20/2011   TSH 1.89 12/20/2011          Assessment & Plan:  Preventive Health Care Counseled regarding healthy nutrition, exercise, sleep, injury prevention, calcium vit d and healthy weight .  Has gyne  Last pap 2012  HT: controlled stay on same regimen   B12  deficiency by report on  Shots  Have lab reports sent to [place] ? If pa vs other     GERD on long term ppi  Risk benefit of medication discussed. malabsorption bone health and infection. Unclear if had egd at some point to r/o barrets. No dysphagia at present disc trying to wean if possible.    Sleep  Disc risk benefit  and new recs  About dec dosing  In female  .she denies problem and feels should stay on same med for  now.

## 2011-12-27 NOTE — Patient Instructions (Signed)
Continue lifestyle intervention healthy eating and exercise . To help with torglycerides. Will check to see if you had upper endoscopy . Consider weaning the protonix for metabolic reasons we discussed.  Get [place] a copy of the labs b12 etc from Dr Lenise Arena

## 2012-06-19 ENCOUNTER — Other Ambulatory Visit: Payer: Self-pay | Admitting: Internal Medicine

## 2012-06-26 ENCOUNTER — Telehealth: Payer: Self-pay | Admitting: Family Medicine

## 2012-06-26 NOTE — Telephone Encounter (Signed)
Last seen 12/27/11 and filled the same day #30 with 5 refills.  No fu scheduled.  Please advise.  Thanks!!!

## 2012-06-26 NOTE — Telephone Encounter (Signed)
Can refill x 1    My last note advised" rov in 6 months" and that is the reason she is running out of med.

## 2012-06-27 ENCOUNTER — Other Ambulatory Visit: Payer: Self-pay | Admitting: Family Medicine

## 2012-06-27 MED ORDER — ZOLPIDEM TARTRATE ER 12.5 MG PO TBCR: 12.5000 mg | EXTENDED_RELEASE_TABLET | Freq: Every evening | ORAL | Status: DC | PRN

## 2012-06-27 NOTE — Telephone Encounter (Signed)
Scheduled pt for fu on 07/15/12.  Pt notified rx called to the pharmacy.

## 2012-07-15 ENCOUNTER — Ambulatory Visit (INDEPENDENT_AMBULATORY_CARE_PROVIDER_SITE_OTHER): Payer: Managed Care, Other (non HMO) | Admitting: Internal Medicine

## 2012-07-15 ENCOUNTER — Encounter: Payer: Self-pay | Admitting: Internal Medicine

## 2012-07-15 VITALS — BP 136/90 | HR 111 | Temp 98.4°F | Wt 176.0 lb

## 2012-07-15 DIAGNOSIS — I10 Essential (primary) hypertension: Secondary | ICD-10-CM

## 2012-07-15 DIAGNOSIS — G479 Sleep disorder, unspecified: Secondary | ICD-10-CM

## 2012-07-15 DIAGNOSIS — E538 Deficiency of other specified B group vitamins: Secondary | ICD-10-CM

## 2012-07-15 MED ORDER — ZOLPIDEM TARTRATE 5 MG PO TABS: 5.0000 mg | ORAL_TABLET | Freq: Every evening | ORAL | Status: DC | PRN

## 2012-07-15 NOTE — Patient Instructions (Addendum)
Decrease ambien as discussed to wean  ambien 5 mg  evenetually  ty to take 2-3 per week .  Can try sample of the intermezzo   If awakens on  Med free nights.  ( 4 hours )

## 2012-07-15 NOTE — Progress Notes (Signed)
Subjective:    Patient ID: Jessica Green, female    DOB: June 23, 1956, 55 y.o.   MRN: [id number]  HPI Patient comes in today for follow up of  multiple medical problems.  Since her last visit about 6 months ago she's had no major changes in her health. She has switched over from injectable B12 to oral B12 because of the shortage of medication. She hasn't followed up with the other doctor at this time but feels okay.  Taking potc b12 at this point  2000  Cg  HT:   bp readings  Seem ok hasnt checked  her readings for while but is taking a medication. No unusual side effects. Still taking ambien  Cr  And considering to go off.  Asks advice about how to wean. When she ran out of her Ambien CR she stopped it for a few days and then cut some and has not but still woke up and mild the night.  She has no pain keeping her awake realizes she can go through withdrawal when she stops Ambien. Review of Systems Negative for chest pain shortness of breath syncope unusual bleeding  Past history family history social history reviewed in the electronic medical record. Outpatient Encounter Prescriptions as of 07/15/2012  Medication Sig Dispense Refill  . cyanocobalamin 2000 MCG tablet Take 2,000 mcg by mouth daily.      Marland Kitchen losartan-hydrochlorothiazide (HYZAAR) 100-12.5 MG per tablet TAKE 1 TABLET BY MOUTH DAILY.  30 tablet  6  . pantoprazole (PROTONIX) 40 MG tablet TAKE 1 TABLET (40 MG TOTAL) BY MOUTH DAILY.  30 tablet  6  . DISCONTD: zolpidem (AMBIEN CR) 12.5 MG CR tablet Take 1 tablet (12.5 mg total) by mouth at bedtime as needed.  30 tablet  0  . zolpidem (AMBIEN) 5 MG tablet Take 1 tablet (5 mg total) by mouth at bedtime as needed for sleep.  30 tablet  1  . DISCONTD: Cyanocobalamin (VITAMIN B-12 IJ) Inject as directed. 1 monthly      . DISCONTD: famotidine (PEPCID) 40 MG tablet Take 40 mg by mouth daily. 1-2 daily            Objective:   Physical Exam BP 136/90  Pulse 111  Temp 98.4 F (36.9 C)  (Oral)  Wt 176 lb (79.833 kg)  SpO2 98% Repeat blood  Pressure.  readings 124/80 / 130/82 Wt Readings from Last 3 Encounters:  07/15/12 176 lb (79.833 kg)  12/27/11 177 lb (80.287 kg)  10/09/11 178 lb (80.74 kg)  WDWN in nad  Looks well Pulse 100 and regular rr  No clubbing cyanosis or edema Oriented x 3 and no noted deficits in memory, attention, and speech. Skin: normal capillary refill ,turgor , color: No acute rashes ,petechiae or bruising    Assessment & Plan:  Sleep     on chronic Ambien discussed weaning different preparations of zolpidem the CR versus the regular acting versus the Internet so. She will go through rebound may want to use medication to times a week as she is weaning. 5 mg Ambien given she can cut this in half also although would not be enough to help her sleep probably. 3 samples of the lower dose intravenous 1 0.75 . Also given for nights he she's not taking Ambien in case she wants to use it for 4 hour window.  HT  seemingly controlled on current medication. Continue.  B12  on oral supplementation advise she get copies of her labs when they  are done next.  Total visit > 50% spent counseling and coordinating care

## 2012-09-16 ENCOUNTER — Ambulatory Visit (INDEPENDENT_AMBULATORY_CARE_PROVIDER_SITE_OTHER): Payer: Managed Care, Other (non HMO) | Admitting: Internal Medicine

## 2012-09-16 ENCOUNTER — Encounter: Payer: Self-pay | Admitting: Internal Medicine

## 2012-09-16 VITALS — BP 110/66 | HR 110 | Temp 97.5°F | Wt 179.0 lb

## 2012-09-16 DIAGNOSIS — I10 Essential (primary) hypertension: Secondary | ICD-10-CM

## 2012-09-16 DIAGNOSIS — Z23 Encounter for immunization: Secondary | ICD-10-CM

## 2012-09-16 DIAGNOSIS — G479 Sleep disorder, unspecified: Secondary | ICD-10-CM

## 2012-09-16 DIAGNOSIS — K449 Diaphragmatic hernia without obstruction or gangrene: Secondary | ICD-10-CM

## 2012-09-16 MED ORDER — ZOLPIDEM TARTRATE 5 MG PO TABS: 5.0000 mg | ORAL_TABLET | Freq: Every evening | ORAL | Status: DC | PRN

## 2012-09-16 NOTE — Patient Instructions (Signed)
CPX  And  labs in January.   14  Ok to continue on the [place] 5 mg for now.

## 2012-09-16 NOTE — Progress Notes (Signed)
  Subjective:    Patient ID: Jessica Green, female    DOB: 07/01/1956, 56 y.o.   MRN: [id number]  HPI In today to discuss her medication. She is switched over from the Ambien CR to the immediate release 5 mg and was planning to try to wean off of the however she's changed her mind and would like to remain on the 5 mg. It seems to work fairly well still awakens. At this time a new event has occurred her husband has lost his job of 30 years at [employer]. We'll be looking for another job like to remain on medication in this transition. Will be on Cobra until further insurance plan. Blood pressure remained stable on her medication. Her last set of labs was in January 2013. No other side effects. Review of Systems No fever chest pain shortness of breath bleeding. Past history family history social history reviewed in the electronic medical record.    Objective:   Physical Exam .BP 110/66  Pulse 110  Temp 97.5 F (36.4 C) (Oral)  Wt 179 lb (81.194 kg)  SpO2 97%  WDWN in nad looks well .record review.  Record reveiew    Assessment & Plan:   Sleep insomnia   Will rewrite Ambien 5 mg #30 refill x5.  Caution risk-benefit patient aware seems to help with minimal side effects  Hypertension continue medication 2 for monitoring labs in January Schedule preventive visit in January while she is still on insurance was. Of labs recheck at that time. Flu vaccine today  Total visit > 50% spent counseling and coordinating care

## 2012-09-19 ENCOUNTER — Encounter: Payer: Self-pay | Admitting: Internal Medicine

## 2012-10-16 ENCOUNTER — Telehealth: Payer: Self-pay | Admitting: Internal Medicine

## 2012-10-16 ENCOUNTER — Encounter (HOSPITAL_COMMUNITY): Payer: Self-pay | Admitting: *Deleted

## 2012-10-16 ENCOUNTER — Emergency Department (HOSPITAL_COMMUNITY)
Admission: EM | Admit: 2012-10-16 | Discharge: 2012-10-16 | Disposition: A | Discharge status: Home / Self Care | Payer: Managed Care, Other (non HMO) | Attending: Emergency Medicine | Admitting: Emergency Medicine

## 2012-10-16 DIAGNOSIS — Z8719 Personal history of other diseases of the digestive system: Secondary | ICD-10-CM | POA: Insufficient documentation

## 2012-10-16 DIAGNOSIS — Z87891 Personal history of nicotine dependence: Secondary | ICD-10-CM | POA: Insufficient documentation

## 2012-10-16 DIAGNOSIS — Z860100 Personal history of colon polyps, unspecified: Secondary | ICD-10-CM | POA: Insufficient documentation

## 2012-10-16 DIAGNOSIS — E785 Hyperlipidemia, unspecified: Secondary | ICD-10-CM | POA: Insufficient documentation

## 2012-10-16 DIAGNOSIS — M899 Disorder of bone, unspecified: Secondary | ICD-10-CM | POA: Insufficient documentation

## 2012-10-16 DIAGNOSIS — Z7982 Long term (current) use of aspirin: Secondary | ICD-10-CM | POA: Insufficient documentation

## 2012-10-16 DIAGNOSIS — Z79899 Other long term (current) drug therapy: Secondary | ICD-10-CM | POA: Insufficient documentation

## 2012-10-16 DIAGNOSIS — I10 Essential (primary) hypertension: Secondary | ICD-10-CM | POA: Insufficient documentation

## 2012-10-16 DIAGNOSIS — R109 Unspecified abdominal pain: Secondary | ICD-10-CM

## 2012-10-16 DIAGNOSIS — R1032 Left lower quadrant pain: Secondary | ICD-10-CM | POA: Insufficient documentation

## 2012-10-16 LAB — CBC WITH DIFFERENTIAL/PLATELET
Basophils Relative: 0 % (ref 0–1)
HCT: 37.1 % (ref 36.0–46.0)
Hemoglobin: 12.3 g/dL (ref 12.0–15.0)
Lymphocytes Relative: 39 % (ref 12–46)
MCHC: 33.2 g/dL (ref 30.0–36.0)
Monocytes Absolute: 0.5 10*3/uL (ref 0.1–1.0)
Monocytes Relative: 6 % (ref 3–12)
Neutro Abs: 5.1 10*3/uL (ref 1.7–7.7)
Neutrophils Relative %: 53 % (ref 43–77)
RBC: 4.43 MIL/uL (ref 3.87–5.11)
WBC: 9.6 10*3/uL (ref 4.0–10.5)

## 2012-10-16 LAB — URINALYSIS, ROUTINE W REFLEX MICROSCOPIC
Glucose, UA: NEGATIVE mg/dL
Hgb urine dipstick: NEGATIVE
Leukocytes, UA: NEGATIVE
Protein, ur: NEGATIVE mg/dL
Specific Gravity, Urine: 1.02 (ref 1.005–1.030)
pH: 5 (ref 5.0–8.0)

## 2012-10-16 LAB — COMPREHENSIVE METABOLIC PANEL
AST: 15 U/L (ref 0–37)
Albumin: 3.5 g/dL (ref 3.5–5.2)
Alkaline Phosphatase: 101 U/L (ref 39–117)
BUN: 18 mg/dL (ref 6–23)
CO2: 27 mEq/L (ref 19–32)
Chloride: 106 mEq/L (ref 96–112)
Creatinine, Ser: 0.94 mg/dL (ref 0.50–1.10)
GFR calc non Af Amer: 66 mL/min — ABNORMAL LOW (ref >90)
Potassium: 3.2 mEq/L — ABNORMAL LOW (ref 3.5–5.1)
Total Bilirubin: 0.3 mg/dL (ref 0.3–1.2)

## 2012-10-16 LAB — LIPASE, BLOOD: Lipase: 39 U/L (ref 11–59)

## 2012-10-16 MED ORDER — IBUPROFEN 400 MG PO TABS: 400.0000 mg | ORAL_TABLET | Freq: Four times a day (QID) | ORAL | Status: AC | PRN

## 2012-10-16 NOTE — ED Provider Notes (Signed)
History     CSN: [id number]  Arrival date & time 10/16/12  1739   First MD Initiated Contact with Patient 10/16/12 1900      Chief Complaint  Patient presents with  . Abdominal Pain    (Consider location/radiation/quality/duration/timing/severity/associated sxs/prior treatment) HPI Pt with LLQ pain only when moving x 1 week. No fever chills, N/V, vaginal or urinary complaints. States she has had loose stools for several days.  Past Medical History  Diagnosis Date  . COLONIC POLYPS, HYPERPLASTIC 01/01/2009  . VARICOSE VEINS, LOWER EXTREMITIES 04/03/2008  . DIVERTICULOSIS, COLON 01/01/2009  . SLEEP DISORDER/DISTURBANCE 08/24/2009  . Other symptoms involving head and neck 08/24/2009  . FRACTURE, RADIUS, RIGHT 06/09/2009  . HIATAL HERNIA WITH REFLUX 03/16/2008  . Osteopenia 04/03/2008    -2.2 spine -2.9 2009 ls   . GERD (gastroesophageal reflux disease)     Has HH   . Hyperlipidemia   . Hypertension     remote hx of phen phen echo nl 1996  . HELLP SYNDROME 01/01/2009  . Paresthesia 2008    Neg eval mri LP no ms    Past Surgical History  Procedure Date  . Steel plate 02/26/8118    Rt arm   . Cesarean section     Family History  Problem Relation Age of Onset  . Alzheimer's disease Mother   . Other Mother     Hemochromatosis  . Cervical cancer Mother   . Hypertension Mother   . Other Father     CVA  . Dementia Father   . Colon cancer Sister   . Other Sister     mesothelioma    History  Substance Use Topics  . Smoking status: Former Games developer  . Smokeless tobacco: Not on file  . Alcohol Use: No    OB History    Grav Para Term Preterm Abortions TAB SAB Ect Mult Living                  Review of Systems  Constitutional: Negative for fever and chills.  HENT: Negative for neck pain.   Respiratory: Negative for shortness of breath.   Cardiovascular: Negative for chest pain.  Gastrointestinal: Positive for abdominal pain and diarrhea. Negative for nausea, vomiting  and constipation.  Genitourinary: Negative for dysuria, frequency, flank pain, vaginal bleeding, vaginal discharge and pelvic pain.  Musculoskeletal: Negative for myalgias and back pain.  Skin: Negative for rash and wound.  Neurological: Negative for dizziness, weakness, light-headedness, numbness and headaches.    Allergies  Review of patient's allergies indicates no known allergies.  Home Medications   Current Outpatient Rx  Name  Route  Sig  Dispense  Refill  . ASPIRIN EC 81 MG PO TBEC   Oral   Take 81 mg by mouth daily.         Marland Kitchen VITAMIN D 1000 UNITS PO TABS   Oral   Take 1,000 Units by mouth daily.         . CYANOCOBALAMIN 2000 MCG PO TABS   Oral   Take 2,000 mcg by mouth daily.         Marland Kitchen LOSARTAN POTASSIUM-HCTZ 100-12.5 MG PO TABS   Oral   Take 1 tablet by mouth daily.         Marland Kitchen PANTOPRAZOLE SODIUM 40 MG PO TBEC   Oral   Take 40 mg by mouth daily.         Marland Kitchen ZOLPIDEM TARTRATE 5 MG PO TABS   Oral  Take 5 mg by mouth at bedtime as needed. For insomnia         . IBUPROFEN 400 MG PO TABS   Oral   Take 1 tablet (400 mg total) by mouth every 6 (six) hours as needed for pain.   30 tablet   0     BP 135/83  Pulse 73  Temp 97.9 F (36.6 C) (Oral)  Resp 16  SpO2 98%  Physical Exam  Nursing note and vitals reviewed. Constitutional: She is oriented to person, place, and time. She appears well-developed and well-nourished. No distress.  HENT:  Head: Normocephalic and atraumatic.  Mouth/Throat: Oropharynx is clear and moist.  Eyes: EOM are normal. Pupils are equal, round, and reactive to light.  Neck: Normal range of motion. Neck supple.  Cardiovascular: Normal rate and regular rhythm.   Pulmonary/Chest: Effort normal and breath sounds normal. No respiratory distress. She has no wheezes. She has no rales.  Abdominal: Soft. Bowel sounds are normal. She exhibits no distension and no mass. There is no tenderness. There is no rebound and no guarding.    Musculoskeletal: Normal range of motion. She exhibits no edema and no tenderness.  Neurological: She is alert and oriented to person, place, and time.  Skin: Skin is warm and dry. No rash noted. No erythema.  Psychiatric: She has a normal mood and affect. Her behavior is normal.    ED Course  Procedures (including critical care time)  Labs Reviewed  COMPREHENSIVE METABOLIC PANEL - Abnormal; Notable for the following:    Potassium 3.2 (*)     Glucose, Bld 103 (*)     GFR calc non Af Amer 66 (*)     GFR calc Af Amer 76 (*)     All other components within normal limits  URINALYSIS, ROUTINE W REFLEX MICROSCOPIC  CBC WITH DIFFERENTIAL  LIPASE, BLOOD   No results found.   1. Abdominal wall pain       MDM  Well appearing. No tenderness with palpation. Able to elicit pain with movement. No masses.   Abd soft, NT. Will treat with anti-inflammatory meds and have return for worsening symptoms or concerns. Will not CT at this time given no pain, normal VS and normal WBC. Pt agrees with plan and will return for any changes/       Loren Racer, MD 10/16/12 2041

## 2012-10-16 NOTE — Telephone Encounter (Signed)
Patient Information:  Caller Name: [place]  Phone: [phone]  Patient: Jessica, Green  Gender: Female  DOB: 1956/10/11  Age: 56 Years  PCP: Berniece Andreas (Family Practice)   Symptoms  Reason For Call & Symptoms: ? diverticulitis.  Onset of left lower abdominal pain 1 week ago, worse on movement.  Reviewed Health History In EMR: Yes  Reviewed Medications In EMR: Yes  Reviewed Allergies In EMR: Yes  Reviewed Surgeries / Procedures: Yes  Date of Onset of Symptoms: 10/09/2012  Guideline(s) Used:  Abdominal Pain - Female  Disposition Per Guideline:   Go to ED Now (or to Office with PCP Approval)  Reason For Disposition Reached:   Constant abdominal pain lasting > 2 hours  Advice Given:  N/A  Office Follow Up:  Does the office need to follow up with this patient?: No  Instructions For The Office: N/A  RN Note:  Pain is constant, worsening daily.  Pain rated 4/10, but constant and is not able to stand completely up from a seated position. Per abdominal pain protocol, advised being seen in ED; patient will go to [facility] ED.

## 2012-10-16 NOTE — ED Notes (Signed)
To ED for eval of llq pain. Diarrhea for the past 3 days. Pt states she called her primary md and was told to come to ed. No pain with urination.

## 2012-10-16 NOTE — ED Notes (Signed)
The patient is AOx4 and comfortable with her discharge instructions. 

## 2012-10-16 NOTE — Telephone Encounter (Signed)
For your review

## 2012-10-22 NOTE — Telephone Encounter (Signed)
Noted  Dx abd wall pain.

## 2012-12-18 ENCOUNTER — Other Ambulatory Visit: Payer: Managed Care, Other (non HMO)

## 2012-12-20 ENCOUNTER — Other Ambulatory Visit: Payer: Managed Care, Other (non HMO)

## 2012-12-30 ENCOUNTER — Encounter: Payer: Managed Care, Other (non HMO) | Admitting: Internal Medicine

## 2013-01-28 ENCOUNTER — Telehealth: Payer: Self-pay | Admitting: Internal Medicine

## 2013-01-28 NOTE — Telephone Encounter (Signed)
Pt missed her CPX in Feb.  Next avaialble CPX is in June.  Pt states she will need refill on her meds before then.  zolpidem (AMBIEN) 5 MG tablet pantoprazole (PROTONIX) 40 MG tablet losartan-hydrochlorothiazide (HYZAAR) 100-12.5 MG per tablet All these will run out end of April.  Do you want to work pt in sooner or is it ok to keep her June appt and go on and refill her meds?

## 2013-01-28 NOTE — Telephone Encounter (Signed)
cpx in April  Or end of march should have slots on Wednesdays and can work in [place] or tues otherwise.

## 2013-01-30 NOTE — Telephone Encounter (Signed)
appt set/kh 

## 2013-02-13 ENCOUNTER — Other Ambulatory Visit (INDEPENDENT_AMBULATORY_CARE_PROVIDER_SITE_OTHER): Payer: BC Managed Care – PPO

## 2013-02-13 DIAGNOSIS — Z0000 Encounter for general adult medical examination without abnormal findings: Secondary | ICD-10-CM

## 2013-02-13 LAB — BASIC METABOLIC PANEL
CO2: 26 mEq/L (ref 19–32)
Calcium: 8.9 mg/dL (ref 8.4–10.5)
Creatinine, Ser: 0.9 mg/dL (ref 0.4–1.2)
GFR: 64.78 mL/min (ref >60.00)
Glucose, Bld: 89 mg/dL (ref 70–99)

## 2013-02-13 LAB — LIPID PANEL
HDL: 48.5 mg/dL (ref >39.00)
LDL Cholesterol: 108 mg/dL — ABNORMAL HIGH (ref 0–99)
Total CHOL/HDL Ratio: 4
Triglycerides: 155 mg/dL — ABNORMAL HIGH (ref 0.0–149.0)
VLDL: 31 mg/dL (ref 0.0–40.0)

## 2013-02-13 LAB — HEPATIC FUNCTION PANEL
AST: 14 U/L (ref 0–37)
Albumin: 3.8 g/dL (ref 3.5–5.2)
Alkaline Phosphatase: 92 U/L (ref 39–117)
Bilirubin, Direct: 0 mg/dL (ref 0.0–0.3)
Total Bilirubin: 0.6 mg/dL (ref 0.3–1.2)

## 2013-02-13 LAB — CBC WITH DIFFERENTIAL/PLATELET
Basophils Absolute: 0.1 10*3/uL (ref 0.0–0.1)
Eosinophils Absolute: 0.2 10*3/uL (ref 0.0–0.7)
Lymphocytes Relative: 36.7 % (ref 12.0–46.0)
MCHC: 33.6 g/dL (ref 30.0–36.0)
Monocytes Relative: 4.8 % (ref 3.0–12.0)
Neutrophils Relative %: 55.8 % (ref 43.0–77.0)
RDW: 13.4 % (ref 11.5–14.6)

## 2013-02-18 ENCOUNTER — Ambulatory Visit (INDEPENDENT_AMBULATORY_CARE_PROVIDER_SITE_OTHER): Payer: BC Managed Care – PPO | Admitting: Internal Medicine

## 2013-02-18 ENCOUNTER — Encounter: Payer: Self-pay | Admitting: Internal Medicine

## 2013-02-18 VITALS — BP 130/90 | HR 88 | Temp 98.4°F | Wt 185.0 lb

## 2013-02-18 DIAGNOSIS — G479 Sleep disorder, unspecified: Secondary | ICD-10-CM

## 2013-02-18 DIAGNOSIS — Z0000 Encounter for general adult medical examination without abnormal findings: Secondary | ICD-10-CM

## 2013-02-18 DIAGNOSIS — Z6830 Body mass index (BMI) 30.0-30.9, adult: Secondary | ICD-10-CM

## 2013-02-18 DIAGNOSIS — I10 Essential (primary) hypertension: Secondary | ICD-10-CM

## 2013-02-18 DIAGNOSIS — K219 Gastro-esophageal reflux disease without esophagitis: Secondary | ICD-10-CM

## 2013-02-18 DIAGNOSIS — E785 Hyperlipidemia, unspecified: Secondary | ICD-10-CM

## 2013-02-18 DIAGNOSIS — Z23 Encounter for immunization: Secondary | ICD-10-CM

## 2013-02-18 MED ORDER — ZOLPIDEM TARTRATE 5 MG PO TABS
5.0000 mg | ORAL_TABLET | Freq: Every evening | ORAL | Status: AC | PRN
Start: 2013-02-18 — End: 2014-02-18

## 2013-02-18 NOTE — Progress Notes (Signed)
Chief Complaint  Patient presents with  . Annual Exam    med refill s sleep  . Hypertension    HPI: Patient comes in today for Preventive Health Care visit  No major change in health status since last visit . Had no insurance for a while  Husband in new job now.  In [place].  BP takin med no se  Not checking levels  GERD ocass sx if eats incorrectly Sleep pretty much taking 5 mg hs   [place] house [place]  Husbands job.    Works window treatment business.   ROS:  GEN/ HEENT: No fever, significant weight changes sweats headaches vision problems hearing changes, CV/ PULM; No chest pain shortness of breath cough, syncope,edema  change in exercise tolerance. GI /GU: No adominal pain, vomiting, change in bowel habits. No blood in the stool. No significant GU symptoms. SKIN/HEME: ,no acute skin rashes suspicious lesions or bleeding. No lymphadenopathy, nodules, masses.  NEURO/ PSYCH:  No neurologic signs such as weakness numbness. No depression anxiety. IMM/ Allergy: No unusual infections.  Allergy .   REST of 12 system review negative except as per HPI   Past Medical History  Diagnosis Date  . COLONIC POLYPS, HYPERPLASTIC 01/01/2009  . VARICOSE VEINS, LOWER EXTREMITIES 04/03/2008  . DIVERTICULOSIS, COLON 01/01/2009  . SLEEP DISORDER/DISTURBANCE 08/24/2009  . Other symptoms involving head and neck 08/24/2009  . FRACTURE, RADIUS, RIGHT 06/09/2009  . HIATAL HERNIA WITH REFLUX 03/16/2008  . Osteopenia 04/03/2008    -2.2 spine -2.9 2009 ls   . GERD (gastroesophageal reflux disease)     Has HH   . Hyperlipidemia   . Hypertension     remote hx of phen phen echo nl 1996  . HELLP SYNDROME 01/01/2009  . Paresthesia 2008    Neg eval mri LP no ms  . NUMBNESS 06/13/2010    Qualifier: Diagnosis of  By: Fabian Sharp MD, Neta Mends 2008 evaluation for paresthesia  Neg MRI and LP per neuro  ( no MS)     Family History  Problem Relation Age of Onset  . Alzheimer's disease Mother   . Other Mother      Hemochromatosis  . Cervical cancer Mother   . Hypertension Mother   . Other Father     CVA  . Dementia Father   . Colon cancer Sister   . Other Sister     mesothelioma    History   Social History  . Marital Status: Married    Spouse Name: N/A    Number of Children: N/A  . Years of Education: N/A   Social History Main Topics  . Smoking status: Former Games developer  . Smokeless tobacco: None  . Alcohol Use: No  . Drug Use: No     Comment: early 20's  . Sexually Active: None   Other Topics Concern  . None   Social History Narrative   Business Owner   HH of 3   2 Cats   G3 P1   Premature birth of son.   Married former smoker    Psychologist, sport and exercise   Husband lost job [employer] recently 2013    Outpatient Encounter Prescriptions as of 02/18/2013  Medication Sig Dispense Refill  . aspirin EC 81 MG tablet Take 81 mg by mouth daily.      . cholecalciferol (VITAMIN D) 1000 UNITS tablet Take 1,000 Units by mouth daily.      . cyanocobalamin 2000 MCG tablet Take 2,000 mcg by mouth  daily.      . ibuprofen (ADVIL,MOTRIN) 400 MG tablet Take 1 tablet (400 mg total) by mouth every 6 (six) hours as needed for pain.  30 tablet  0  . losartan-hydrochlorothiazide (HYZAAR) 100-12.5 MG per tablet Take 1 tablet by mouth daily.      . pantoprazole (PROTONIX) 40 MG tablet Take 40 mg by mouth daily.      Marland Kitchen zolpidem (AMBIEN) 5 MG tablet Take 1 tablet (5 mg total) by mouth at bedtime as needed. For insomnia  30 tablet  5  . [DISCONTINUED] zolpidem (AMBIEN) 5 MG tablet Take 5 mg by mouth at bedtime as needed. For insomnia       No facility-administered encounter medications on file as of 02/18/2013.    EXAM:  BP 130/90  Pulse 88  Temp(Src) 98.4 F (36.9 C) (Oral)  Wt 185 lb (83.915 kg)  BMI 30.79 kg/m2  SpO2 97%  Body mass index is 30.79 kg/(m^2).  Physical Exam: Vital signs reviewed GMW:NUUV is a well-developed well-nourished alert cooperative   female who appears her stated age in  no acute distress.  HEENT: normocephalic atraumatic , Eyes: PERRL EOM's full, conjunctiva clear, Nares: paten,t no deformity discharge or tenderness., Ears: no deformity EAC's clear TMs with normal landmarks. Mouth: clear OP, no lesions, edema.  Moist mucous membranes. Dentition in adequate repair. NECK: supple without masses, thyromegaly or bruits. CHEST/PULM:  Clear to auscultation and percussion breath sounds equal no wheeze , rales or rhonchi. No chest wall deformities or tenderness. Breast: normal by inspection . No dimpling, discharge, masses, tenderness or discharge . CV: PMI is nondisplaced, S1 S2 no gallops, murmurs, rubs. Peripheral pulses are full without delay.No JVD .  ABDOMEN: Bowel sounds normal nontender  No guard or rebound, no hepato splenomegal no CVA tenderness.  No hernia. Extremtities:  No clubbing cyanosis or edema, no acute joint swelling or redness no focal atrophy NEURO:  Oriented x3, cranial nerves 3-12 appear to be intact, no obvious focal weakness,gait within normal limits no abnormal reflexes or asymmetrical SKIN: No acute rashes normal turgor, color, no bruising or petechiae. PSYCH: Oriented, good eye contact, no obvious depression anxiety, cognition and judgment appear normal. LN: no cervical axillary inguinal adenopathy  Lab Results  Component Value Date   WBC 8.0 02/13/2013   HGB 13.8 02/13/2013   HCT 41.0 02/13/2013   PLT 238.0 02/13/2013   GLUCOSE 89 02/13/2013   CHOL 187 02/13/2013   TRIG 155.0* 02/13/2013   HDL 48.50 02/13/2013   LDLDIRECT 119.3 12/20/2011   LDLCALC 108* 02/13/2013   ALT 15 02/13/2013   AST 14 02/13/2013   NA 137 02/13/2013   K 4.4 02/13/2013   CL 104 02/13/2013   CREATININE 0.9 02/13/2013   BUN 20 02/13/2013   CO2 26 02/13/2013   TSH 0.35 02/13/2013    ASSESSMENT AND PLAN:  Discussed the following assessment and plan:  Visit for preventive health examination - updatae tdap   SLEEP DISORDER/DISTURBANCE  HYPERTENSION  Need for Tdap  vaccination - Plan: Tdap vaccine greater than or equal to 7yo IM  HYPERLIPIDEMIA  GERD  BMI 30.0-30.9,adult Risk-benefit of medications discussed monitor for blood pressure control borderline elevated today lifestyle intervention reviewed to continue. Laboratory tests today  monitoring will let her nose these results sign up for patient to portal followup in 6 months if still in [place]. Counseled regarding healthy nutrition, exercise, sleep, injury prevention, calcium vit d and healthy weight .  Patient Care Team: Neta Mends  Fabian Sharp, MD as PCP - [facility] Louis Meckel, MD (Gastroenterology) Patient Instructions  Check blood pressure readings to confirm control. tdap today .  lifestyle intervention healthy eating and exercise .   Continue sleep hygiene.   Preventive Care for Adults, Female A healthy lifestyle and preventive care can promote health and wellness. Preventive health guidelines for women include the following key practices.  A routine yearly physical is a good way to check with your caregiver about your health and preventive screening. It is a chance to share any concerns and updates on your health, and to receive a thorough exam.  Visit your dentist for a routine exam and preventive care every 6 months. Brush your teeth twice a day and floss once a day. Good oral hygiene prevents tooth decay and gum disease.  The frequency of eye exams is based on your age, health, family medical history, use of contact lenses, and other factors. Follow your caregiver's recommendations for frequency of eye exams.  Eat a healthy diet. Foods like vegetables, fruits, whole grains, low-fat dairy products, and lean protein foods contain the nutrients you need without too many calories. Decrease your intake of foods high in solid fats, added sugars, and salt. Eat the right amount of calories for you.Get information about a proper diet from your caregiver, if  necessary.  Regular physical exercise is one of the most important things you can do for your health. Most adults should get at least 150 minutes of moderate-intensity exercise (any activity that increases your heart rate and causes you to sweat) each week. In addition, most adults need muscle-strengthening exercises on 2 or more days a week.  Maintain a healthy weight. The body mass index (BMI) is a screening tool to identify possible weight problems. It provides an estimate of body fat based on height and weight. Your caregiver can help determine your BMI, and can help you achieve or maintain a healthy weight.For adults 20 years and older:  A BMI below 18.5 is considered underweight.  A BMI of 18.5 to 24.9 is normal.  A BMI of 25 to 29.9 is considered overweight.  A BMI of 30 and above is considered obese.  Maintain normal blood lipids and cholesterol levels by exercising and minimizing your intake of saturated fat. Eat a balanced diet with plenty of fruit and vegetables. Blood tests for lipids and cholesterol should begin at age 63 and be repeated every 5 years. If your lipid or cholesterol levels are high, you are over 50, or you are at high risk for heart disease, you may need your cholesterol levels checked more frequently.Ongoing high lipid and cholesterol levels should be treated with medicines if diet and exercise are not effective.  If you smoke, find out from your caregiver how to quit. If you do not use tobacco, do not start.  If you are pregnant, do not drink alcohol. If you are breastfeeding, be very cautious about drinking alcohol. If you are not pregnant and choose to drink alcohol, do not exceed 1 drink per day. One drink is considered to be 12 ounces (355 mL) of beer, 5 ounces (148 mL) of wine, or 1.5 ounces (44 mL) of liquor.  Avoid use of street drugs. Do not share needles with anyone. Ask for help if you need support or instructions about stopping the use of drugs.  High  blood pressure causes heart disease and increases the risk of stroke. Your blood pressure should be checked at least every 1  to 2 years. Ongoing high blood pressure should be treated with medicines if weight loss and exercise are not effective.  If you are 77 to 59 years old, ask your caregiver if you should take aspirin to prevent strokes.  Diabetes screening involves taking a blood sample to check your fasting blood sugar level. This should be done once every 3 years, after age 45, if you are within normal weight and without risk factors for diabetes. Testing should be considered at a younger age or be carried out more frequently if you are overweight and have at least 1 risk factor for diabetes.  Breast cancer screening is essential preventive care for women. You should practice "breast self-awareness." This means understanding the normal appearance and feel of your breasts and may include breast self-examination. Any changes detected, no matter how small, should be reported to a caregiver. Women in their 73s and 30s should have a clinical breast exam (CBE) by a caregiver as part of a regular health exam every 1 to 3 years. After age 34, women should have a CBE every year. Starting at age 64, women should consider having a mammography (breast X-ray test) every year. Women who have a family history of breast cancer should talk to their caregiver about genetic screening. Women at a high risk of breast cancer should talk to their caregivers about having magnetic resonance imaging (MRI) and a mammography every year.  The Pap test is a screening test for cervical cancer. A Pap test can show cell changes on the cervix that might become cervical cancer if left untreated. A Pap test is a procedure in which cells are obtained and examined from the lower end of the uterus (cervix).  Women should have a Pap test starting at age 63.  Between ages 65 and 51, Pap tests should be repeated every 2 years.  Beginning  at age 47, you should have a Pap test every 3 years as long as the past 3 Pap tests have been normal.  Some women have medical problems that increase the chance of getting cervical cancer. Talk to your caregiver about these problems. It is especially important to talk to your caregiver if a new problem develops soon after your last Pap test. In these cases, your caregiver may recommend more frequent screening and Pap tests.  The above recommendations are the same for women who have or have not gotten the vaccine for human papillomavirus (HPV).  If you had a hysterectomy for a problem that was not cancer or a condition that could lead to cancer, then you no longer need Pap tests. Even if you no longer need a Pap test, a regular exam is a good idea to make sure no other problems are starting.  If you are between ages 16 and 88, and you have had normal Pap tests going back 10 years, you no longer need Pap tests. Even if you no longer need a Pap test, a regular exam is a good idea to make sure no other problems are starting.  If you have had past treatment for cervical cancer or a condition that could lead to cancer, you need Pap tests and screening for cancer for at least 20 years after your treatment.  If Pap tests have been discontinued, risk factors (such as a new sexual partner) need to be reassessed to determine if screening should be resumed.  The HPV test is an additional test that may be used for cervical cancer screening. The HPV test  looks for the virus that can cause the cell changes on the cervix. The cells collected during the Pap test can be tested for HPV. The HPV test could be used to screen women aged 6 years and older, and should be used in women of any age who have unclear Pap test results. After the age of 13, women should have HPV testing at the same frequency as a Pap test.  Colorectal cancer can be detected and often prevented. Most routine colorectal cancer screening begins at  the age of 65 and continues through age 69. However, your caregiver may recommend screening at an earlier age if you have risk factors for colon cancer. On a yearly basis, your caregiver may provide home test kits to check for hidden blood in the stool. Use of a small camera at the end of a tube, to directly examine the colon (sigmoidoscopy or colonoscopy), can detect the earliest forms of colorectal cancer. Talk to your caregiver about this at age 21, when routine screening begins. Direct examination of the colon should be repeated every 5 to 10 years through age 19, unless early forms of pre-cancerous polyps or small growths are found.  Hepatitis C blood testing is recommended for all people born from 18 through 1967 and any individual with known risks for hepatitis C.  Practice safe sex. Use condoms and avoid high-risk sexual practices to reduce the spread of sexually transmitted infections (STIs). STIs include gonorrhea, chlamydia, syphilis, trichomonas, herpes, HPV, and human immunodeficiency virus (HIV). Herpes, HIV, and HPV are viral illnesses that have no cure. They can result in disability, cancer, and death. Sexually active women aged 64 and younger should be checked for chlamydia. Older women with new or multiple partners should also be tested for chlamydia. Testing for other STIs is recommended if you are sexually active and at increased risk.  Osteoporosis is a disease in which the bones lose minerals and strength with aging. This can result in serious bone fractures. The risk of osteoporosis can be identified using a bone density scan. Women ages 18 and over and women at risk for fractures or osteoporosis should discuss screening with their caregivers. Ask your caregiver whether you should take a calcium supplement or vitamin D to reduce the rate of osteoporosis.  Menopause can be associated with physical symptoms and risks. Hormone replacement therapy is available to decrease symptoms and  risks. You should talk to your caregiver about whether hormone replacement therapy is right for you.  Use sunscreen with sun protection factor (SPF) of 30 or more. Apply sunscreen liberally and repeatedly throughout the day. You should seek shade when your shadow is shorter than you. Protect yourself by wearing long sleeves, pants, a wide-brimmed hat, and sunglasses year round, whenever you are outdoors.  Once a month, do a whole body skin exam, using a mirror to look at the skin on your back. Notify your caregiver of new moles, moles that have irregular borders, moles that are larger than a pencil eraser, or moles that have changed in shape or color.  Stay current with required immunizations.  Influenza. You need a dose every fall (or winter). The composition of the flu vaccine changes each year, so being vaccinated once is not enough.  Pneumococcal polysaccharide. You need 1 to 2 doses if you smoke cigarettes or if you have certain chronic medical conditions. You need 1 dose at age 87 (or older) if you have never been vaccinated.  Tetanus, diphtheria, pertussis (Tdap, Td). Get 1  dose of Tdap vaccine if you are younger than age 20, are over 26 and have contact with an infant, are a Research scientist (physical sciences), are pregnant, or simply want to be protected from whooping cough. After that, you need a Td booster dose every 10 years. Consult your caregiver if you have not had at least 3 tetanus and diphtheria-containing shots sometime in your life or have a deep or dirty wound.  HPV. You need this vaccine if you are a woman age 14 or younger. The vaccine is given in 3 doses over 6 months.  Measles, mumps, rubella (MMR). You need at least 1 dose of MMR if you were born in 1957 or later. You may also need a second dose.  Meningococcal. If you are age 41 to 15 and a first-year college student living in a residence hall, or have one of several medical conditions, you need to get vaccinated against meningococcal  disease. You may also need additional booster doses.  Zoster (shingles). If you are age 65 or older, you should get this vaccine.  Varicella (chickenpox). If you have never had chickenpox or you were vaccinated but received only 1 dose, talk to your caregiver to find out if you need this vaccine.  Hepatitis A. You need this vaccine if you have a specific risk factor for hepatitis A virus infection or you simply wish to be protected from this disease. The vaccine is usually given as 2 doses, 6 to 18 months apart.  Hepatitis B. You need this vaccine if you have a specific risk factor for hepatitis B virus infection or you simply wish to be protected from this disease. The vaccine is given in 3 doses, usually over 6 months.   How to Take Your Blood Pressure  These instructions are only for electronic home blood pressure machines. You will need:   An automatic or semi-automatic blood pressure machine.  Fresh batteries for the blood pressure machine. HOW DO I USE THESE TOOLS TO CHECK MY BLOOD PRESSURE?   There are 2 numbers that make up your blood pressure. For example: 120/80.  The first number (120 in our example) is called the "systolic pressure." It is a measure of the pressure in your blood vessels when your heart is pumping blood.  The second number (80 in our example) is called the "diastolic pressure." It is a measure of the pressure in your blood vessels when your heart is resting between beats.  Before you buy a home blood pressure machine, check the size of your arm so you can buy the right size cuff. Here is how to check the size of your arm:  Use a tape measure that shows both inches and centimeters.  Wrap the tape measure around the middle upper part of your arm. You may need someone to help you measure right.  Write down your arm measurement in both inches and centimeters.  To measure your blood pressure right, it is important to have the right size cuff.  If your arm is  up to 13 inches (37 to 34 centimeters), get an adult cuff size.  If your arm is 13 to 17 inches (35 to 44 centimeters), get a large adult cuff size.  If your arm is 17 to 20 inches (45 to 52 centimeters), get an adult thigh cuff.  Try to rest or relax for at least 30 minutes before you check your blood pressure.  Do not smoke.  Do not have any drinks with caffeine, such as:  Pop.  Coffee.  Tea.  Check your blood pressure in a quiet room.  Sit down and stretch out your arm on a table. Keep your arm at about the level of your heart. Let your arm relax. GETTING BLOOD PRESSURE READINGS  Make sure you remove any tight-fighting clothing from your arm. Wrap the cuff around your upper arm. Wrap it just above the bend, and above where you felt the pulse. You should be able to slip a finger between the cuff and your arm. If you cannot slip a finger in the cuff, it is too tight and should be removed and rewrapped.  Some units requires you to manually pump up the arm cuff.  Automatic units inflate the cuff when you press a button.  Cuff deflation is automatic in both models.  After the cuff is inflated, the unit measures your blood pressure and pulse. The readings are displayed on a monitor. Hold still and breathe normally while the cuff is inflated.  Getting a reading takes less than a minute.  Some models store readings in a memory. Some provide a printout of readings.  Get readings at different times of the day. You should wait at least 5 minutes between readings. Take readings with you to your next doctor's visit. Document Released: 10/19/2008 Document Revised: 01/29/2012 Document Reviewed: 10/19/2008 [facility] Patient Information 2013 [place], [place].     Neta Mends. Stanford Strauch M.D. Health Maintenance  Topic Date Due  . Influenza Vaccine  07/21/2013  . Mammogram  09/17/2014  . Pap Smear  01/19/2015  . Colonoscopy  08/31/2020  . Tetanus/tdap  02/19/2023   Health Maintenance  Review 1}

## 2013-02-18 NOTE — Patient Instructions (Addendum)
Check blood pressure readings to confirm control. tdap today .  lifestyle intervention healthy eating and exercise .   Continue sleep hygiene.   Preventive Care for Adults, Female A healthy lifestyle and preventive care can promote health and wellness. Preventive health guidelines for women include the following key practices.  A routine yearly physical is a good way to check with your caregiver about your health and preventive screening. It is a chance to share any concerns and updates on your health, and to receive a thorough exam.  Visit your dentist for a routine exam and preventive care every 6 months. Brush your teeth twice a day and floss once a day. Good oral hygiene prevents tooth decay and gum disease.  The frequency of eye exams is based on your age, health, family medical history, use of contact lenses, and other factors. Follow your caregiver's recommendations for frequency of eye exams.  Eat a healthy diet. Foods like vegetables, fruits, whole grains, low-fat dairy products, and lean protein foods contain the nutrients you need without too many calories. Decrease your intake of foods high in solid fats, added sugars, and salt. Eat the right amount of calories for you.Get information about a proper diet from your caregiver, if necessary.  Regular physical exercise is one of the most important things you can do for your health. Most adults should get at least 150 minutes of moderate-intensity exercise (any activity that increases your heart rate and causes you to sweat) each week. In addition, most adults need muscle-strengthening exercises on 2 or more days a week.  Maintain a healthy weight. The body mass index (BMI) is a screening tool to identify possible weight problems. It provides an estimate of body fat based on height and weight. Your caregiver can help determine your BMI, and can help you achieve or maintain a healthy weight.For adults 20 years and older:  A BMI below  18.5 is considered underweight.  A BMI of 18.5 to 24.9 is normal.  A BMI of 25 to 29.9 is considered overweight.  A BMI of 30 and above is considered obese.  Maintain normal blood lipids and cholesterol levels by exercising and minimizing your intake of saturated fat. Eat a balanced diet with plenty of fruit and vegetables. Blood tests for lipids and cholesterol should begin at age 68 and be repeated every 5 years. If your lipid or cholesterol levels are high, you are over 50, or you are at high risk for heart disease, you may need your cholesterol levels checked more frequently.Ongoing high lipid and cholesterol levels should be treated with medicines if diet and exercise are not effective.  If you smoke, find out from your caregiver how to quit. If you do not use tobacco, do not start.  If you are pregnant, do not drink alcohol. If you are breastfeeding, be very cautious about drinking alcohol. If you are not pregnant and choose to drink alcohol, do not exceed 1 drink per day. One drink is considered to be 12 ounces (355 mL) of beer, 5 ounces (148 mL) of wine, or 1.5 ounces (44 mL) of liquor.  Avoid use of street drugs. Do not share needles with anyone. Ask for help if you need support or instructions about stopping the use of drugs.  High blood pressure causes heart disease and increases the risk of stroke. Your blood pressure should be checked at least every 1 to 2 years. Ongoing high blood pressure should be treated with medicines if weight loss and exercise  are not effective.  If you are 53 to 59 years old, ask your caregiver if you should take aspirin to prevent strokes.  Diabetes screening involves taking a blood sample to check your fasting blood sugar level. This should be done once every 3 years, after age 64, if you are within normal weight and without risk factors for diabetes. Testing should be considered at a younger age or be carried out more frequently if you are overweight and  have at least 1 risk factor for diabetes.  Breast cancer screening is essential preventive care for women. You should practice "breast self-awareness." This means understanding the normal appearance and feel of your breasts and may include breast self-examination. Any changes detected, no matter how small, should be reported to a caregiver. Women in their 45s and 30s should have a clinical breast exam (CBE) by a caregiver as part of a regular health exam every 1 to 3 years. After age 57, women should have a CBE every year. Starting at age 79, women should consider having a mammography (breast X-ray test) every year. Women who have a family history of breast cancer should talk to their caregiver about genetic screening. Women at a high risk of breast cancer should talk to their caregivers about having magnetic resonance imaging (MRI) and a mammography every year.  The Pap test is a screening test for cervical cancer. A Pap test can show cell changes on the cervix that might become cervical cancer if left untreated. A Pap test is a procedure in which cells are obtained and examined from the lower end of the uterus (cervix).  Women should have a Pap test starting at age 79.  Between ages 37 and 45, Pap tests should be repeated every 2 years.  Beginning at age 8, you should have a Pap test every 3 years as long as the past 3 Pap tests have been normal.  Some women have medical problems that increase the chance of getting cervical cancer. Talk to your caregiver about these problems. It is especially important to talk to your caregiver if a new problem develops soon after your last Pap test. In these cases, your caregiver may recommend more frequent screening and Pap tests.  The above recommendations are the same for women who have or have not gotten the vaccine for human papillomavirus (HPV).  If you had a hysterectomy for a problem that was not cancer or a condition that could lead to cancer, then you  no longer need Pap tests. Even if you no longer need a Pap test, a regular exam is a good idea to make sure no other problems are starting.  If you are between ages 29 and 41, and you have had normal Pap tests going back 10 years, you no longer need Pap tests. Even if you no longer need a Pap test, a regular exam is a good idea to make sure no other problems are starting.  If you have had past treatment for cervical cancer or a condition that could lead to cancer, you need Pap tests and screening for cancer for at least 20 years after your treatment.  If Pap tests have been discontinued, risk factors (such as a new sexual partner) need to be reassessed to determine if screening should be resumed.  The HPV test is an additional test that may be used for cervical cancer screening. The HPV test looks for the virus that can cause the cell changes on the cervix. The cells collected  during the Pap test can be tested for HPV. The HPV test could be used to screen women aged 61 years and older, and should be used in women of any age who have unclear Pap test results. After the age of 38, women should have HPV testing at the same frequency as a Pap test.  Colorectal cancer can be detected and often prevented. Most routine colorectal cancer screening begins at the age of 29 and continues through age 65. However, your caregiver may recommend screening at an earlier age if you have risk factors for colon cancer. On a yearly basis, your caregiver may provide home test kits to check for hidden blood in the stool. Use of a small camera at the end of a tube, to directly examine the colon (sigmoidoscopy or colonoscopy), can detect the earliest forms of colorectal cancer. Talk to your caregiver about this at age 2, when routine screening begins. Direct examination of the colon should be repeated every 5 to 10 years through age 79, unless early forms of pre-cancerous polyps or small growths are found.  Hepatitis C blood  testing is recommended for all people born from [birth year] through 1967 and any individual with known risks for hepatitis C.  Practice safe sex. Use condoms and avoid high-risk sexual practices to reduce the spread of sexually transmitted infections (STIs). STIs include gonorrhea, chlamydia, syphilis, trichomonas, herpes, HPV, and human immunodeficiency virus (HIV). Herpes, HIV, and HPV are viral illnesses that have no cure. They can result in disability, cancer, and death. Sexually active women aged 57 and younger should be checked for chlamydia. Older women with new or multiple partners should also be tested for chlamydia. Testing for other STIs is recommended if you are sexually active and at increased risk.  Osteoporosis is a disease in which the bones lose minerals and strength with aging. This can result in serious bone fractures. The risk of osteoporosis can be identified using a bone density scan. Women ages 37 and over and women at risk for fractures or osteoporosis should discuss screening with their caregivers. Ask your caregiver whether you should take a calcium supplement or vitamin D to reduce the rate of osteoporosis.  Menopause can be associated with physical symptoms and risks. Hormone replacement therapy is available to decrease symptoms and risks. You should talk to your caregiver about whether hormone replacement therapy is right for you.  Use sunscreen with sun protection factor (SPF) of 30 or more. Apply sunscreen liberally and repeatedly throughout the day. You should seek shade when your shadow is shorter than you. Protect yourself by wearing long sleeves, pants, a wide-brimmed hat, and sunglasses year round, whenever you are outdoors.  Once a month, do a whole body skin exam, using a mirror to look at the skin on your back. Notify your caregiver of new moles, moles that have irregular borders, moles that are larger than a pencil eraser, or moles that have changed in shape or  color.  Stay current with required immunizations.  Influenza. You need a dose every fall (or winter). The composition of the flu vaccine changes each year, so being vaccinated once is not enough.  Pneumococcal polysaccharide. You need 1 to 2 doses if you smoke cigarettes or if you have certain chronic medical conditions. You need 1 dose at age 76 (or older) if you have never been vaccinated.  Tetanus, diphtheria, pertussis (Tdap, Td). Get 1 dose of Tdap vaccine if you are younger than age 71, are over 25 and have  contact with an infant, are a Research scientist (physical sciences), are pregnant, or simply want to be protected from whooping cough. After that, you need a Td booster dose every 10 years. Consult your caregiver if you have not had at least 3 tetanus and diphtheria-containing shots sometime in your life or have a deep or dirty wound.  HPV. You need this vaccine if you are a woman age 74 or younger. The vaccine is given in 3 doses over 6 months.  Measles, mumps, rubella (MMR). You need at least 1 dose of MMR if you were born in 1957 or later. You may also need a second dose.  Meningococcal. If you are age 77 to 30 and a first-year college student living in a residence hall, or have one of several medical conditions, you need to get vaccinated against meningococcal disease. You may also need additional booster doses.  Zoster (shingles). If you are age 62 or older, you should get this vaccine.  Varicella (chickenpox). If you have never had chickenpox or you were vaccinated but received only 1 dose, talk to your caregiver to find out if you need this vaccine.  Hepatitis A. You need this vaccine if you have a specific risk factor for hepatitis A virus infection or you simply wish to be protected from this disease. The vaccine is usually given as 2 doses, 6 to 18 months apart.  Hepatitis B. You need this vaccine if you have a specific risk factor for hepatitis B virus infection or you simply wish to be  protected from this disease. The vaccine is given in 3 doses, usually over 6 months.   How to Take Your Blood Pressure  These instructions are only for electronic home blood pressure machines. You will need:   An automatic or semi-automatic blood pressure machine.  Fresh batteries for the blood pressure machine. HOW DO I USE THESE TOOLS TO CHECK MY BLOOD PRESSURE?   There are 2 numbers that make up your blood pressure. For example: 120/80.  The first number (120 in our example) is called the "systolic pressure." It is a measure of the pressure in your blood vessels when your heart is pumping blood.  The second number (80 in our example) is called the "diastolic pressure." It is a measure of the pressure in your blood vessels when your heart is resting between beats.  Before you buy a home blood pressure machine, check the size of your arm so you can buy the right size cuff. Here is how to check the size of your arm:  Use a tape measure that shows both inches and centimeters.  Wrap the tape measure around the middle upper part of your arm. You may need someone to help you measure right.  Write down your arm measurement in both inches and centimeters.  To measure your blood pressure right, it is important to have the right size cuff.  If your arm is up to 13 inches (37 to 34 centimeters), get an adult cuff size.  If your arm is 13 to 17 inches (35 to 44 centimeters), get a large adult cuff size.  If your arm is 17 to 20 inches (45 to 52 centimeters), get an adult thigh cuff.  Try to rest or relax for at least 30 minutes before you check your blood pressure.  Do not smoke.  Do not have any drinks with caffeine, such as:  Pop.  Coffee.  Tea.  Check your blood pressure in a quiet room.  Sit  down and stretch out your arm on a table. Keep your arm at about the level of your heart. Let your arm relax. GETTING BLOOD PRESSURE READINGS  Make sure you remove any tight-fighting  clothing from your arm. Wrap the cuff around your upper arm. Wrap it just above the bend, and above where you felt the pulse. You should be able to slip a finger between the cuff and your arm. If you cannot slip a finger in the cuff, it is too tight and should be removed and rewrapped.  Some units requires you to manually pump up the arm cuff.  Automatic units inflate the cuff when you press a button.  Cuff deflation is automatic in both models.  After the cuff is inflated, the unit measures your blood pressure and pulse. The readings are displayed on a monitor. Hold still and breathe normally while the cuff is inflated.  Getting a reading takes less than a minute.  Some models store readings in a memory. Some provide a printout of readings.  Get readings at different times of the day. You should wait at least 5 minutes between readings. Take readings with you to your next doctor's visit. Document Released: 10/19/2008 Document Revised: 01/29/2012 Document Reviewed: 10/19/2008 [facility] Patient Information 2013 [place], [place].

## 2013-03-19 ENCOUNTER — Other Ambulatory Visit: Payer: Managed Care, Other (non HMO)

## 2013-04-11 ENCOUNTER — Other Ambulatory Visit: Payer: Self-pay | Admitting: Internal Medicine

## 2013-04-16 ENCOUNTER — Other Ambulatory Visit: Payer: Managed Care, Other (non HMO)

## 2013-04-22 ENCOUNTER — Encounter: Payer: Managed Care, Other (non HMO) | Admitting: Internal Medicine

## 2013-09-25 ENCOUNTER — Other Ambulatory Visit: Payer: Self-pay

## 2013-10-28 ENCOUNTER — Other Ambulatory Visit: Payer: Self-pay | Admitting: Internal Medicine

## 2014-01-22 ENCOUNTER — Other Ambulatory Visit: Payer: Self-pay | Admitting: Internal Medicine

## 2014-04-19 ENCOUNTER — Other Ambulatory Visit: Payer: Self-pay | Admitting: Internal Medicine

## 2014-04-20 NOTE — Telephone Encounter (Signed)
Called the patient to make an appt.  Not seen in over one year.  Pt now lives in [place].  No need for refills.

## 2014-08-20 ENCOUNTER — Encounter: Payer: Self-pay | Admitting: Gastroenterology

## 2014-11-09 ENCOUNTER — Other Ambulatory Visit: Payer: Self-pay | Admitting: Internal Medicine

## 2014-11-09 NOTE — Telephone Encounter (Signed)
Denied.  Not seen since 4/14.  Needs OV

## 2015-08-03 ENCOUNTER — Encounter: Payer: Self-pay | Admitting: Gastroenterology
